# Patient Record
Sex: Male | Born: 1980 | Race: Black or African American | Hispanic: No | Marital: Single | Smoking: Current every day smoker
Health system: Southern US, Community
[De-identification: ages and names within clinical notes are randomized; demographics above are authoritative.]

## PROBLEM LIST (undated history)

## (undated) DIAGNOSIS — R011 Cardiac murmur, unspecified: Secondary | ICD-10-CM

## (undated) DIAGNOSIS — S0990XA Unspecified injury of head, initial encounter: Secondary | ICD-10-CM

## (undated) DIAGNOSIS — S329XXA Fracture of unspecified parts of lumbosacral spine and pelvis, initial encounter for closed fracture: Secondary | ICD-10-CM

## (undated) DIAGNOSIS — J939 Pneumothorax, unspecified: Secondary | ICD-10-CM

## (undated) DIAGNOSIS — R569 Unspecified convulsions: Secondary | ICD-10-CM

## (undated) HISTORY — PX: KNEE SURGERY: SHX244

## (undated) HISTORY — PX: THORACENTESIS: SHX235

---
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
## 2000-04-09 ENCOUNTER — Emergency Department (HOSPITAL_COMMUNITY): Admission: EM | Admit: 2000-04-09 | Discharge: 2000-04-09 | Payer: Self-pay | Admitting: Emergency Medicine

## 2000-04-10 ENCOUNTER — Encounter: Payer: Self-pay | Admitting: Emergency Medicine

## 2003-11-05 ENCOUNTER — Emergency Department (HOSPITAL_COMMUNITY): Admission: EM | Admit: 2003-11-05 | Discharge: 2003-11-05 | Payer: Self-pay | Admitting: Family Medicine

## 2005-08-12 ENCOUNTER — Ambulatory Visit (HOSPITAL_COMMUNITY): Admission: RE | Admit: 2005-08-12 | Discharge: 2005-08-12 | Payer: Self-pay | Admitting: Family Medicine

## 2005-08-12 ENCOUNTER — Emergency Department (HOSPITAL_COMMUNITY): Admission: EM | Admit: 2005-08-12 | Discharge: 2005-08-12 | Payer: Self-pay | Admitting: Family Medicine

## 2006-03-02 ENCOUNTER — Emergency Department (HOSPITAL_COMMUNITY): Admission: EM | Admit: 2006-03-02 | Discharge: 2006-03-02 | Payer: Self-pay | Admitting: Emergency Medicine

## 2006-05-26 DIAGNOSIS — S329XXA Fracture of unspecified parts of lumbosacral spine and pelvis, initial encounter for closed fracture: Secondary | ICD-10-CM

## 2006-05-26 HISTORY — DX: Fracture of unspecified parts of lumbosacral spine and pelvis, initial encounter for closed fracture: S32.9XXA

## 2006-05-26 HISTORY — PX: BONY PELVIS SURGERY: SHX572

## 2006-06-20 ENCOUNTER — Inpatient Hospital Stay (HOSPITAL_COMMUNITY): Admission: EM | Admit: 2006-06-20 | Discharge: 2006-07-09 | Payer: Self-pay | Admitting: Emergency Medicine

## 2006-06-22 ENCOUNTER — Encounter: Payer: Self-pay | Admitting: Vascular Surgery

## 2006-06-29 ENCOUNTER — Ambulatory Visit: Payer: Self-pay | Admitting: Physical Medicine & Rehabilitation

## 2006-07-21 ENCOUNTER — Emergency Department (HOSPITAL_COMMUNITY): Admission: EM | Admit: 2006-07-21 | Discharge: 2006-07-21 | Payer: Self-pay | Admitting: Emergency Medicine

## 2006-08-03 ENCOUNTER — Ambulatory Visit (HOSPITAL_COMMUNITY): Admission: RE | Admit: 2006-08-03 | Discharge: 2006-08-03 | Payer: Self-pay | Admitting: Orthopaedic Surgery

## 2006-08-28 ENCOUNTER — Encounter: Admission: RE | Admit: 2006-08-28 | Discharge: 2006-08-28 | Payer: Self-pay | Admitting: Neurosurgery

## 2006-10-06 ENCOUNTER — Inpatient Hospital Stay (HOSPITAL_COMMUNITY): Admission: AD | Admit: 2006-10-06 | Discharge: 2006-10-09 | Payer: Self-pay | Admitting: Orthopedic Surgery

## 2009-06-04 ENCOUNTER — Emergency Department (HOSPITAL_COMMUNITY): Admission: EM | Admit: 2009-06-04 | Discharge: 2009-06-04 | Payer: Self-pay | Admitting: Family Medicine

## 2010-02-28 DIAGNOSIS — J930 Spontaneous tension pneumothorax: Secondary | ICD-10-CM | POA: Insufficient documentation

## 2010-02-28 DIAGNOSIS — J189 Pneumonia, unspecified organism: Secondary | ICD-10-CM | POA: Insufficient documentation

## 2010-02-28 DIAGNOSIS — D568 Other thalassemias: Secondary | ICD-10-CM

## 2010-02-28 DIAGNOSIS — R011 Cardiac murmur, unspecified: Secondary | ICD-10-CM

## 2010-02-28 DIAGNOSIS — J939 Pneumothorax, unspecified: Secondary | ICD-10-CM | POA: Insufficient documentation

## 2010-02-28 DIAGNOSIS — D560 Alpha thalassemia: Secondary | ICD-10-CM | POA: Insufficient documentation

## 2010-03-04 ENCOUNTER — Ambulatory Visit: Payer: Self-pay | Admitting: Cardiology

## 2010-03-04 ENCOUNTER — Ambulatory Visit (HOSPITAL_COMMUNITY): Admission: RE | Admit: 2010-03-04 | Discharge: 2010-03-04 | Payer: Self-pay

## 2010-03-04 ENCOUNTER — Encounter: Payer: Self-pay | Admitting: Cardiovascular Disease

## 2010-03-04 ENCOUNTER — Ambulatory Visit: Payer: Self-pay | Admitting: Cardiovascular Disease

## 2010-03-06 ENCOUNTER — Telehealth (INDEPENDENT_AMBULATORY_CARE_PROVIDER_SITE_OTHER): Payer: Self-pay | Admitting: *Deleted
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
## 2010-06-25 NOTE — Progress Notes (Signed)
  Mailed Pt 3 ROI on 02/17/10.. she completed I recieved back today faxed records to [facility] on [address] office, The other Samule Dry is for records to go to [facility] I hae forwarded that ROI to [facility]. [facility]  March 06, 2010 11:12 AM

## 2010-06-25 NOTE — Letter (Signed)
Summary: [facility] Surgical Clearance   [facility] Surgical Clearance   Imported By: Roderic Ovens 03/11/2010 15:06:33  _____________________________________________________________________  External Attachment:    Type:   Image     Comment:   External Document
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
## 2010-10-11 NOTE — Op Note (Signed)
[name], [name]               ACCOUNT NO.:  [id number]   MEDICAL RECORD NO.:  [id number]          PATIENT TYPE:  AMB   LOCATION:  SDS                          FACILITY:  [facility]   PHYSICIAN:  Mark C. Ophelia Charter, M.D.    DATE OF BIRTH:  Oct 11, 1980   DATE OF PROCEDURE:  08/03/2006  DATE OF DISCHARGE:                               OPERATIVE REPORT   PREOPERATIVE DIAGNOSIS:  healed pelvic  fracture.,  external fixator   POSTOPERATIVE DIAGNOSIS:  healed pelvic  fracture, external fixator   PROCEDURE:  Removal of external fixator of the pelvis.   SURGEON:  Mark C. Ophelia Charter, M.D.   ANESTHESIA:  GOT.   DESCRIPTION OF PROCEDURE:  After induction of general anesthesia, pins  were sprayed.  Fixator was loosened, disconnected from the pins and then  sterilized since the patient wanted to keep it.  The pins were then  removed using the power driver under slow speed.  Then 4x4 dressings  were applied and tape.  Prior to applying the dressings, the pelvis was  grasped on both sides and stressed both vertically as well as open book  as well as with compression and there appeared to be a stable pelvic  ring.  The patient will do pin tract care at home and return in 1 week  for [place] single stance pelvic x-rays in my office.  Patient  tolerated the procedure well and will be discharged home today.      Mark C. Ophelia Charter, M.D.  Electronically Signed     MCY/MEDQ  D:  08/03/2006  T:  08/03/2006  Job:  [id number]
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
## 2011-12-22 ENCOUNTER — Emergency Department (INDEPENDENT_AMBULATORY_CARE_PROVIDER_SITE_OTHER)
Admission: EM | Admit: 2011-12-22 | Discharge: 2011-12-22 | Disposition: A | Discharge status: Other Acute Inpatient Hospital | Payer: BC Managed Care – PPO | Source: Home / Self Care | Attending: Family Medicine | Admitting: Family Medicine

## 2011-12-22 ENCOUNTER — Emergency Department (INDEPENDENT_AMBULATORY_CARE_PROVIDER_SITE_OTHER): Payer: BC Managed Care – PPO

## 2011-12-22 ENCOUNTER — Emergency Department (HOSPITAL_COMMUNITY)
Admission: EM | Admit: 2011-12-22 | Discharge: 2011-12-22 | Disposition: A | Discharge status: Home / Self Care | Payer: BC Managed Care – PPO | Attending: Emergency Medicine | Admitting: Emergency Medicine

## 2011-12-22 ENCOUNTER — Encounter (HOSPITAL_COMMUNITY): Payer: Self-pay | Admitting: *Deleted

## 2011-12-22 ENCOUNTER — Encounter (HOSPITAL_COMMUNITY): Payer: Self-pay

## 2011-12-22 DIAGNOSIS — Z86718 Personal history of other venous thrombosis and embolism: Secondary | ICD-10-CM | POA: Insufficient documentation

## 2011-12-22 DIAGNOSIS — R071 Chest pain on breathing: Secondary | ICD-10-CM

## 2011-12-22 DIAGNOSIS — R0602 Shortness of breath: Secondary | ICD-10-CM | POA: Insufficient documentation

## 2011-12-22 DIAGNOSIS — R079 Chest pain, unspecified: Secondary | ICD-10-CM

## 2011-12-22 DIAGNOSIS — F17200 Nicotine dependence, unspecified, uncomplicated: Secondary | ICD-10-CM | POA: Insufficient documentation

## 2011-12-22 DIAGNOSIS — G40909 Epilepsy, unspecified, not intractable, without status epilepticus: Secondary | ICD-10-CM | POA: Insufficient documentation

## 2011-12-22 HISTORY — DX: Pneumothorax, unspecified: J93.9

## 2011-12-22 HISTORY — DX: Unspecified convulsions: R56.9

## 2011-12-22 HISTORY — DX: Cardiac murmur, unspecified: R01.1

## 2011-12-22 LAB — D-DIMER, QUANTITATIVE: D-Dimer, Quant: 0.29 ug/mL-FEU (ref 0.00–0.48)

## 2011-12-22 MED ORDER — MORPHINE SULFATE 2 MG/ML IJ SOLN
INTRAMUSCULAR | Status: AC
  Filled 2011-12-22: qty 1

## 2011-12-22 MED ORDER — KETOROLAC TROMETHAMINE 30 MG/ML IJ SOLN
30.0000 mg | Freq: Once | INTRAMUSCULAR | Status: AC
  Administered 2011-12-22: 30 mg via INTRAVENOUS
  Filled 2011-12-22: qty 1

## 2011-12-22 MED ORDER — SODIUM CHLORIDE 0.9 % IV SOLN
Freq: Once | INTRAVENOUS | Status: AC
  Administered 2011-12-22: 15:00:00 via INTRAVENOUS

## 2011-12-22 MED ORDER — MORPHINE SULFATE 2 MG/ML IJ SOLN
2.0000 mg | Freq: Once | INTRAMUSCULAR | Status: AC
  Administered 2011-12-22: 2 mg via INTRAVENOUS

## 2011-12-22 MED ORDER — NAPROXEN 500 MG PO TABS: 500.0000 mg | ORAL_TABLET | Freq: Two times a day (BID) | ORAL | Status: DC

## 2011-12-22 MED ORDER — NAPROXEN 250 MG PO TABS
500.0000 mg | ORAL_TABLET | Freq: Once | ORAL | Status: AC
  Administered 2011-12-22: 500 mg via ORAL
  Filled 2011-12-22: qty 2

## 2011-12-22 NOTE — ED Provider Notes (Signed)
I have seen and examined this patient with the resident.  I agree with the resident's note, assessment and plan except as indicated.    Patient presents with left-sided chest pain with sudden onset.  He states that similar to his prior pneumothorax.  At this time he did have a chest x-ray completed at the urgent care which appears normal by the radiology results and my visualization of the chest x-ray as well.  Patient notably has oxygen saturations of 100% on room air as well.  Because the pain is pleuritic we will consider PE although the patient again is not hypoxic and not tachycardic.  He has no specific risk factors or hemoptysis we believe he is low risk and we will obtain a d-dimer to further assess for this possibility.  Nat Christen, MD 12/22/11 [phone]

## 2011-12-22 NOTE — ED Notes (Signed)
To ED via ambulance
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
## 2011-12-22 NOTE — ED Notes (Signed)
Hist of pneumothorax, left side w susequent treatment and resolution; unable to breathe deeply or speak

## 2011-12-22 NOTE — ED Notes (Signed)
Pt came from urgent care after having shortness or breath and hurts to breath just today.  History of pneumothorax. And chest xray at urgent care negative.  Pt received Morphine at urgent care and per nurse there reported patient said he had a seizure. Patient has history of seizures.  Pt  Is alert and talking with no voice.  Texting

## 2011-12-22 NOTE — ED Provider Notes (Signed)
History     CSN: [id number]  Arrival date & time 12/22/11  1521   First MD Initiated Contact with Patient 12/22/11 1526      Chief Complaint  Patient presents with  . Shortness of Breath    (Consider location/radiation/quality/duration/timing/severity/associated sxs/prior treatment) Patient is a 31 y.o. male presenting with chest pain. The history is provided by the patient.  Chest Pain The chest pain began 3 - 5 hours ago. Duration of episode(s) is 4 hours. Chest pain occurs constantly. The chest pain is unchanged. The pain is associated with breathing and exertion. The severity of the pain is moderate. The quality of the pain is described as sharp and similar to previous episodes. The pain radiates to the mid back. Chest pain is worsened by certain positions and deep breathing. Primary symptoms include shortness of breath. Pertinent negatives for primary symptoms include no fever, no fatigue, no cough, no wheezing, no abdominal pain, no nausea and no vomiting.  Pertinent negatives for associated symptoms include no numbness and no weakness. He tried nothing for the symptoms.  His past medical history is significant for DVT and spontaneous pneumothorax.     Past Medical History  Diagnosis Date  . Pneumothorax, left   . Seizures   . Murmur     Past Surgical History  Procedure Date  . Thoracentesis     History reviewed. No pertinent family history.  History  Substance Use Topics  . Smoking status: Current Everyday Smoker  . Smokeless tobacco: Not on file  . Alcohol Use: Yes      Review of Systems  Constitutional: Negative for fever, activity change, appetite change and fatigue.  HENT: Negative for congestion, sore throat, facial swelling, rhinorrhea, trouble swallowing, neck pain, neck stiffness, voice change and sinus pressure.   Eyes: Negative.   Respiratory: Positive for shortness of breath. Negative for cough, choking, chest tightness and wheezing.     Cardiovascular: Positive for chest pain.  Gastrointestinal: Negative for nausea, vomiting and abdominal pain.  Genitourinary: Negative for dysuria, urgency, frequency, hematuria, flank pain and difficulty urinating.  Musculoskeletal: Negative for back pain and gait problem.  Skin: Negative for rash and wound.  Neurological: Negative for facial asymmetry, weakness, numbness and headaches.  Psychiatric/Behavioral: Negative for behavioral problems, confusion and agitation. The patient is not nervous/anxious and is not hyperactive.   All other systems reviewed and are negative.    Allergies  Review of patient's allergies indicates not on file.  Home Medications  No current outpatient prescriptions on file.  BP 119/80  Pulse 64  Temp 98.5 F (36.9 C) (Oral)  Resp 18  SpO2 97%  Physical Exam  Nursing note and vitals reviewed. Constitutional: He is oriented to person, place, and time. He appears well-developed and well-nourished. No distress.  HENT:  Head: Normocephalic and atraumatic.  Right Ear: External ear normal.  Left Ear: External ear normal.  Mouth/Throat: No oropharyngeal exudate.  Eyes: Conjunctivae and EOM are normal. Pupils are equal, round, and reactive to light. Right eye exhibits no discharge. Left eye exhibits no discharge.  Neck: Normal range of motion. Neck supple. No JVD present. No tracheal deviation present. No thyromegaly present.  Cardiovascular: Normal rate, regular rhythm, normal heart sounds and intact distal pulses.  Exam reveals no gallop and no friction rub.   No murmur heard. Pulmonary/Chest: Effort normal and breath sounds normal. No respiratory distress. He has no wheezes. He exhibits tenderness (pain with palpation of left chest wall).  Abdominal: Soft. Bowel sounds  are normal. He exhibits no distension. There is no tenderness. There is no rebound and no guarding.  Musculoskeletal: Normal range of motion. He exhibits no edema and no tenderness.   Lymphadenopathy:    He has no cervical adenopathy.  Neurological: He is alert and oriented to person, place, and time. No cranial nerve deficit.  Skin: Skin is warm and dry. No rash noted. He is not diaphoretic. No pallor.  Psychiatric: He has a normal mood and affect. His behavior is normal.    ED Course  Procedures (including critical care time)   Labs Reviewed  D-DIMER, QUANTITATIVE   Dg Chest 2 View  12/22/2011  *RADIOLOGY REPORT*  Clinical Data: Shortness of breath, sudden onset of left-sided chest pain, history of pneumothorax  CHEST - 2 VIEW  Comparison: 08/03/2006; 06/24/2006; 06/20/2006; chest CT - 06/20/2006  Findings: Grossly unchanged cardiac silhouette and mediastinal contours. Scattered punctate granulomas, best appreciated on the lateral radiograph, are grossly unchanged and the sequela of prior granulomatous infection.  No new focal parenchymal opacities.  The lungs are hyperexpanded with flattening of bilateral hemidiaphragms.  No focal airspace opacities.  No definite pleural effusion or pneumothorax.  No acute osseous abnormalities.  IMPRESSION: No acute cardiopulmonary disease.  Original Report Authenticated By: Waynard Reeds, M.D.     No diagnosis found.    MDM  31 year old male with past medical history of seizures spontaneous pneumothorax and a DVT associated with a PICC line placed in 2009 not on anticoagulation presents with left-sided pleuritic chest pain. Patient says this pain is similar to pain he felt with previous pneumothorax, patient having difficulty taking deep breaths and speaking secondary to pain in the left side of chest radiating to his mid back. No recent illnesses nausea vomiting fevers. Patient lower risk for ACS given lack of past medical history age and atypical presentation of chest pain. Chest x-ray at urgent care clinic does not show pneumothorax or pneumonia, EKG does not show evidence of pericarditis or ST elevations. Given pleuritic  nature of pain and history of blood clots we'll order d-dimer is patient is otherwise low risk given normal vital signs. Will help treat pain and emergency department with Toradol.  Results for orders placed during the hospital encounter of 12/22/11  D-DIMER, QUANTITATIVE      Component Value Range   D-Dimer, Quant 0.29  0.00 - 0.48 ug/mL-FEU     On reevaluation patient appears better with less splinting and speaking more laterally and clearly. Patient still says pain is there. Patient with normal chest x-ray negative d-dimer normal EKG with pleuritic left-sided chest pain worse with movement of arm. Likely musculoskeletal or pleurisy in nature. We'll prescribe patient's naproxen and tell him to return to the emergency department with any worsening symptoms  Case discussed with Dr. Bobby Rumpf, MD 12/22/11 1705

## 2011-12-25 NOTE — ED Provider Notes (Signed)
I have seen and examined this patient with the resident.  I agree with the resident's note, assessment and plan except as indicated.     Beauden Tremont A Kymere Fullington, MD 12/25/11 0102 

## 2012-08-30 ENCOUNTER — Emergency Department (INDEPENDENT_AMBULATORY_CARE_PROVIDER_SITE_OTHER)
Admission: EM | Admit: 2012-08-30 | Discharge: 2012-08-30 | Disposition: A | Discharge status: Home / Self Care | Payer: BC Managed Care – PPO | Source: Home / Self Care

## 2012-08-30 ENCOUNTER — Encounter (HOSPITAL_COMMUNITY): Payer: Self-pay | Admitting: *Deleted

## 2012-08-30 DIAGNOSIS — Z0000 Encounter for general adult medical examination without abnormal findings: Secondary | ICD-10-CM

## 2012-08-30 HISTORY — DX: Unspecified injury of head, initial encounter: S09.90XA

## 2012-08-30 HISTORY — DX: Fracture of unspecified parts of lumbosacral spine and pelvis, initial encounter for closed fracture: S32.9XXA
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
## 2012-08-30 NOTE — ED Notes (Signed)
Girlfriend has a UTI but had neg STD check.  Now he wants an STD check.

## 2013-05-17 ENCOUNTER — Encounter (HOSPITAL_COMMUNITY): Payer: Self-pay | Admitting: Emergency Medicine

## 2013-05-17 ENCOUNTER — Emergency Department (HOSPITAL_COMMUNITY)
Admission: EM | Admit: 2013-05-17 | Discharge: 2013-05-17 | Disposition: A | Discharge status: Home / Self Care | Payer: BC Managed Care – PPO | Source: Home / Self Care | Attending: Family Medicine | Admitting: Family Medicine

## 2013-05-17 DIAGNOSIS — K2970 Gastritis, unspecified, without bleeding: Secondary | ICD-10-CM

## 2013-05-17 DIAGNOSIS — J069 Acute upper respiratory infection, unspecified: Secondary | ICD-10-CM

## 2013-05-17 DIAGNOSIS — K2990 Gastroduodenitis, unspecified, without bleeding: Secondary | ICD-10-CM

## 2013-05-17 MED ORDER — IPRATROPIUM BROMIDE 0.06 % NA SOLN: 2.0000 | Freq: Four times a day (QID) | NASAL | Status: DC

## 2013-05-17 MED ORDER — ONDANSETRON HCL 8 MG PO TABS: 8.0000 mg | ORAL_TABLET | Freq: Three times a day (TID) | ORAL | Status: DC | PRN

## 2013-05-17 MED ORDER — TRAMADOL HCL 50 MG PO TABS: 50.0000 mg | ORAL_TABLET | Freq: Four times a day (QID) | ORAL | Status: DC | PRN

## 2013-05-17 MED ORDER — GI COCKTAIL ~~LOC~~
30.0000 mL | Freq: Once | ORAL | Status: AC
  Administered 2013-05-17: 30 mL via ORAL

## 2013-05-17 MED ORDER — ONDANSETRON 4 MG PO TBDP
ORAL_TABLET | ORAL | Status: AC
  Filled 2013-05-17: qty 2

## 2013-05-17 MED ORDER — ONDANSETRON 4 MG PO TBDP
8.0000 mg | ORAL_TABLET | Freq: Once | ORAL | Status: AC
  Administered 2013-05-17: 8 mg via ORAL

## 2013-05-17 MED ORDER — OMEPRAZOLE 40 MG PO CPDR: 40.0000 mg | DELAYED_RELEASE_CAPSULE | Freq: Every day | ORAL | Status: DC

## 2013-05-17 MED ORDER — GI COCKTAIL ~~LOC~~
ORAL | Status: AC
  Filled 2013-05-17: qty 30

## 2013-05-17 NOTE — ED Notes (Signed)
Pt  Reports symptoms  Of  Nasal  Congestion  /     Cough        Body  Aches         X  sev  Weeks   Started  Vomiting  Today  -   Symptoms  Not  releived  By otc  meds

## 2013-05-17 NOTE — ED Provider Notes (Signed)
[name] is a 31 y.o. male who presents to Urgent Care today for  1) one and a half weeks of nasal congestion cough runny nose. Patient has tried some over-the-counter medications including Sudafed these medications as well as some ibuprofen. Additionally he notes he's had some back pain of the past her weeks because he works for [employer] and has been very busy recently. He has been taking a lot of aspirin containing products for this. 2) he does not upper left quadrant abdominal pain and vomiting starting today. He denies any melanotic stools or blood in his vomit. His abdominal pain is mild. Of note he drinks 1-3 cans of beer per week.  No fevers chills or trouble breathing chest pains or palpitations.   Past Medical History  Diagnosis Date  . Pneumothorax, left   . Seizures   . Murmur   . Pelvic fracture 2008    external fixator  . Closed head injury     "bleeding and bruising of brain on both sides"   History  Substance Use Topics  . Smoking status: Current Every Day Smoker -- 0.30 packs/day    Types: Cigarettes  . Smokeless tobacco: Not on file  . Alcohol Use: 0.0 oz/week    14-28 Cans of beer per week   ROS as above Medications reviewed. No current facility-administered medications for this encounter.   Current Outpatient Prescriptions  Medication Sig Dispense Refill  . divalproex (DEPAKOTE SPRINKLE) 125 MG capsule Take 125 mg by mouth 2 (two) times daily.        Exam:  BP 137/77  Pulse 78  Temp(Src) 98 F (36.7 C) (Oral)  Resp 16  SpO2 100% Gen: Well NAD non-toxic appear HEENT: EOMI,  MMM, tympanic membranes are normal appearing bilaterally. Posterior pharynx is mildly erythematous. Lungs: Normal work of breathing. CTABL Heart: RRR no MRG Abd: NABS, Soft. Nondistended. Mildly tender palpation of the left quadrant. No rebound or guarding. Exts: Non edematous BL  LE, warm and well perfused.   Patient was given a GI cocktail, and some Zofran. He had some  improvement in symptoms   Assessment and Plan: 32 y.o. male with  1) Viral URI: Plan for symptomatic management with Tylenol, and Atrovent nasal spray. 2) gastritis: Plan to abstain from aspirin or ibuprofen products. Will use Tylenol and tramadol for pain. Will use omeprazole for acid blocking and Zofran for any further vomiting. Work note provided. Discussed warning signs or symptoms. Please see discharge instructions. Patient expresses understanding.      Rodolph Bong, MD 05/17/13 [phone]

## 2013-08-08 ENCOUNTER — Emergency Department (HOSPITAL_COMMUNITY)
Admission: EM | Admit: 2013-08-08 | Discharge: 2013-08-08 | Disposition: A | Discharge status: Home / Self Care | Payer: BC Managed Care – PPO | Source: Home / Self Care | Attending: Emergency Medicine | Admitting: Emergency Medicine

## 2013-08-08 ENCOUNTER — Encounter (HOSPITAL_COMMUNITY): Payer: Self-pay | Admitting: Emergency Medicine

## 2013-08-08 DIAGNOSIS — S1093XA Contusion of unspecified part of neck, initial encounter: Secondary | ICD-10-CM

## 2013-08-08 DIAGNOSIS — S0003XA Contusion of scalp, initial encounter: Secondary | ICD-10-CM

## 2013-08-08 DIAGNOSIS — S0083XA Contusion of other part of head, initial encounter: Secondary | ICD-10-CM

## 2013-08-08 MED ORDER — DICLOFENAC SODIUM 75 MG PO TBEC: 75.0000 mg | DELAYED_RELEASE_TABLET | Freq: Two times a day (BID) | ORAL | Status: DC

## 2013-08-08 MED ORDER — HYDROCODONE-ACETAMINOPHEN 5-325 MG PO TABS
2.0000 | ORAL_TABLET | Freq: Once | ORAL | Status: AC
  Administered 2013-08-08: 2 via ORAL

## 2013-08-08 MED ORDER — HYDROCODONE-ACETAMINOPHEN 5-325 MG PO TABS
ORAL_TABLET | ORAL | Status: AC
  Filled 2013-08-08: qty 2

## 2013-08-08 MED ORDER — DIVALPROEX SODIUM 125 MG PO CPSP: 125.0000 mg | ORAL_CAPSULE | Freq: Two times a day (BID) | ORAL | Status: DC
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
## 2013-08-08 NOTE — Discharge Instructions (Signed)
Head Injury, Adult °You have a head injury. Headaches and throwing up (vomiting) are common after a head injury. It should be easy to wake up from sleeping. Sometimes you must stay in the hospital. Most problems happen within the first 24 hours. Side effects may occur up to 7 10 days after the injury.  °WHAT ARE THE TYPES OF HEAD INJURIES? °Head injuries can be as minor as a bump. Some head injuries can be more severe. More severe head injuries include: °· A jarring injury to the brain (concussion). °· A bruise of the brain (contusion). This mean there is bleeding in the brain that can cause swelling. °· A cracked skull (skull fracture). °· Bleeding in the brain that collects, clots, and forms a bump (hematoma). °. °WHEN SHOULD I GET HELP RIGHT AWAY?  °· You are confused or sleepy. °· You cannot be woken up. °· You feel sick to your stomach (nauseous) or keep throwing up. °· Your dizziness or unsteadiness is get worse. °· You have very bad, lasting headaches that are not helped by medicine. °· You cannot use your arms or legs like normal °· You cannot walk. °· You notice changes in the black spots in the center of the colored part of your eye (pupil). °· You have clear or bloody fluid coming from your nose or ears. °· You have trouble seeing. °During the next 24 hours after the injury, you must stay with someone who can watch you. This person should get help right away (call 911 in the [place] if you start to shake and are not able to control it (seizures), you become pass out, or you are unable to wake up. °HOW CAN I PREVENT A HEAD INJURY IN THE FUTURE? °· Wear seat belts. °· Wear helmets while bike riding and playing sports like football. °· Stay away from dangerous activities around the house. °WHEN CAN I RETURN TO NORMAL ACTIVITIES AND ATHLETICS? °See your doctor before doing these activities. You should not do normal activities or play contact sports until 1 week after the following symptoms have  stopped: °· Headache that does not go away. °· Dizziness. °· Poor attention. °· Confusion. °· Memory problems. °· Sickness to your stomach or throwing up. °· Tiredness. °· Fussiness. °· Bothered by bright lights or loud noises. °· Anxiousness or depression. °· Restless sleep. °MAKE SURE YOU:  °· Understand these instructions. °· Will watch your condition. °· Will get help right away if you are not doing well or get worse. °Document Released: 04/24/2008 Document Revised: 03/02/2013 Document Reviewed: 01/17/2013 °ExitCare® Patient Information ©2014 [employer]. ° °
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
## 2013-08-09 ENCOUNTER — Encounter: Payer: Self-pay | Admitting: Neurology

## 2013-08-09 ENCOUNTER — Ambulatory Visit (INDEPENDENT_AMBULATORY_CARE_PROVIDER_SITE_OTHER): Payer: BC Managed Care – PPO | Admitting: Neurology

## 2013-08-09 VITALS — BP 128/78 | HR 65 | Ht 72.0 in | Wt 166.0 lb

## 2013-08-09 DIAGNOSIS — R569 Unspecified convulsions: Secondary | ICD-10-CM

## 2013-08-12 DIAGNOSIS — R569 Unspecified convulsions: Secondary | ICD-10-CM | POA: Insufficient documentation

## 2013-08-12 MED ORDER — DIVALPROEX SODIUM ER 500 MG PO TB24: 500.0000 mg | ORAL_TABLET | Freq: Two times a day (BID) | ORAL | Status: DC
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
## 2013-08-16 ENCOUNTER — Ambulatory Visit (INDEPENDENT_AMBULATORY_CARE_PROVIDER_SITE_OTHER): Payer: BC Managed Care – PPO | Admitting: Radiology

## 2013-08-16 DIAGNOSIS — R569 Unspecified convulsions: Secondary | ICD-10-CM
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
## 2013-08-22 ENCOUNTER — Emergency Department (HOSPITAL_COMMUNITY)
Admission: EM | Admit: 2013-08-22 | Discharge: 2013-08-22 | Disposition: A | Discharge status: Home / Self Care | Payer: BC Managed Care – PPO | Source: Home / Self Care | Attending: Emergency Medicine | Admitting: Emergency Medicine

## 2013-08-22 ENCOUNTER — Other Ambulatory Visit (HOSPITAL_COMMUNITY)
Admission: RE | Admit: 2013-08-22 | Discharge: 2013-08-22 | Disposition: A | Discharge status: Home / Self Care | Payer: BC Managed Care – PPO | Source: Ambulatory Visit | Attending: Emergency Medicine | Admitting: Emergency Medicine

## 2013-08-22 ENCOUNTER — Encounter (HOSPITAL_COMMUNITY): Payer: Self-pay | Admitting: Emergency Medicine

## 2013-08-22 DIAGNOSIS — Z202 Contact with and (suspected) exposure to infections with a predominantly sexual mode of transmission: Secondary | ICD-10-CM

## 2013-08-22 DIAGNOSIS — Z113 Encounter for screening for infections with a predominantly sexual mode of transmission: Secondary | ICD-10-CM | POA: Insufficient documentation

## 2013-08-22 NOTE — ED Provider Notes (Signed)
  Chief Complaint   Chief Complaint  Patient presents with  . Exposure to STD    History of Present Illness   [name] is a 33 year old male who comes in today to be checked for STDs. The patient states that he has been monogamous for years but that recently he had an affair with another woman. His wife doesn't know about this. The other partner told him that she tested positive for HPV on Pap smear. He himself has had no symptoms of warts in the past and no current lesions. He denies any urethral discharge, dysuria, penile pain, sores, blisters, ulcers, or any type of skin lesion on the penis. He has had no inguinal lymphadenopathy no testicular pain or swelling. He does not have any prior history of STDs.  Review of Systems   Other than as noted above, the patient denies any of the following symptoms: Systemic:  No fevers chills, arthralgias, or adenopathy. GI:  No abdominal pain, nausea or vomiting. GU:  No dysuria, penile pain, discharge, itching, dysuria, genital lesions, testicular pain or swelling. Skin:  No rash or itching.  PMFSH   Past medical history, family history, social history, meds, and allergies were reviewed. He has a history of seizures and takes Depakote.  Physical Examination    Vital signs:  BP 136/84  Pulse 77  Temp(Src) 98.5 F (36.9 C) (Oral)  Resp 18  SpO2 100% Gen:  Alert, oriented, in no distress. Abdomen:  Soft and flat, non-distended, and non-tender.  No organomegaly or mass. Genital:  Was completely normal. No evidence of warts. No urethral discharge. No lesions on the penis. Testes are normal. No inguinal lymphadenopathy Skin:  Warm and dry.  No rash.    Labs   DNA probe for gonorrhea, Chlamydia, Trichomonas were obtained. Also obtain serologies for HIV and syphilis. Results are pending. Will call patient back about any positive results.  Assessment   The encounter diagnosis was Potential exposure to STD.  No evidence of  warts.  Plan    1.  Meds:  The following meds were prescribed:   Discharge Medication List as of 08/22/2013  7:00 PM      2.  Patient Education/Counseling:  The patient was given appropriate handouts, self care instructions, and instructed in symptomatic relief.The patient was instructed to inform all sexual contacts, avoid intercourse completely for 2 weeks and then only with a condom.  The patient was told that we would call about all abnormal lab results, and that we would need to report certain kinds of infection to the health department.    3.  Follow up:  The patient was told to follow up here if no better in 3 to 4 days, or sooner if becoming worse in any way, and given some red flag symptoms such as fever, pain, or difficulty urinating which would prompt immediate return.       Reuben Likesavid C Giang Hemme, MD 08/22/13 2122

## 2013-08-22 NOTE — ED Notes (Signed)
Pt is here to be checked for STD for poss exposure He is asymptomatic Alert w/no signs of acute distress.

## 2013-08-22 NOTE — Discharge Instructions (Signed)
You have been diagnosed with a possible STD.  Your results should be back in  1 - 3 days.  We will call you with the results of any positive tests, so if you don't hear from us, you can assume your results are all negative.  If you wish, you can call us here at [phone] and ask for a nurse to give you the results.  They can give you the results of all your tests over the phone.  If your HIV should come back positive, we must give you this result in person to protect your confidentiality.  We can give you a negative HIV result over the phone.   ° °In the meantime, you should avoid intercourse altogether for 1 week.  After that, you should always use condoms--100% of the time.  This will not only prevent pregnancy, but has been shown to prevent HIV, syphilis, gonorrhea, chlamydia, hepatis C and other STDs. ° °If your test comes back positive, we are required by law to report it to the Health Department.  We also suggest you inform your partner or partners so they can get tested and treated as well. ° °You can get STD testing for free at the [facility].  It is recommended that you have repeat testing for HIV and syphilis in 3 and 6 months, since it can take a while for these tests to become positive.  ° °

## 2013-08-23 LAB — RPR: RPR: NONREACTIVE

## 2013-08-23 LAB — URINE CYTOLOGY ANCILLARY ONLY
Chlamydia: NEGATIVE
Neisseria Gonorrhea: NEGATIVE
Trichomonas: NEGATIVE

## 2013-08-23 LAB — HIV ANTIBODY (ROUTINE TESTING W REFLEX): HIV: NONREACTIVE

## 2013-08-25 ENCOUNTER — Telehealth (HOSPITAL_COMMUNITY): Payer: Self-pay | Admitting: *Deleted

## 2013-08-25 NOTE — ED Notes (Signed)
Pt. called on VM but no message. I called pt. back. Pt. verified x 2 and given results- all neg. I explained that I don't call neg. results.  He asked for the HPV result. I told him we did not do one. He said that is what he came to get checked.  Discussed with Dr. Lorenz CoasterKeller. He said there is no HPV test and he told him that when he was here. He said he checked him and he did not have any warts. I told pt. this. He said he did not understand that it was just going to be a visual check for it. Vassie MoselleYork, Newman Waren M 08/25/2013

## 2013-10-07 ENCOUNTER — Encounter: Payer: Self-pay | Admitting: Neurology

## 2013-10-07 ENCOUNTER — Encounter (INDEPENDENT_AMBULATORY_CARE_PROVIDER_SITE_OTHER): Payer: Self-pay

## 2013-10-07 ENCOUNTER — Ambulatory Visit (INDEPENDENT_AMBULATORY_CARE_PROVIDER_SITE_OTHER): Payer: BC Managed Care – PPO | Admitting: Neurology

## 2013-10-07 VITALS — BP 123/76 | HR 66 | Ht 73.0 in | Wt 166.0 lb

## 2013-10-07 DIAGNOSIS — R569 Unspecified convulsions: Secondary | ICD-10-CM

## 2013-10-07 LAB — COMPREHENSIVE METABOLIC PANEL
A/G RATIO: 1.6 (ref 1.1–2.5)
ALBUMIN: 4.1 g/dL (ref 3.5–5.5)
ALT: 13 IU/L (ref 0–44)
AST: 16 IU/L (ref 0–40)
Alkaline Phosphatase: 42 IU/L (ref 39–117)
BUN / CREAT RATIO: 20 — AB (ref 8–19)
BUN: 16 mg/dL (ref 6–20)
CO2: 23 mmol/L (ref 18–29)
CREATININE: 0.82 mg/dL (ref 0.76–1.27)
Calcium: 8.9 mg/dL (ref 8.7–10.2)
Chloride: 104 mmol/L (ref 96–108)
GFR, EST AFRICAN AMERICAN: 133 mL/min/{1.73_m2} (ref >59)
GFR, EST NON AFRICAN AMERICAN: 115 mL/min/{1.73_m2} (ref >59)
GLOBULIN, TOTAL: 2.5 g/dL (ref 1.5–4.5)
GLUCOSE: 91 mg/dL (ref 65–99)
Potassium: 4.4 mmol/L (ref 3.5–5.2)
Sodium: 138 mmol/L (ref 134–144)
TOTAL PROTEIN: 6.6 g/dL (ref 6.0–8.5)

## 2013-10-07 LAB — CBC
HEMATOCRIT: 31.3 % — AB (ref 37.5–51.0)
HEMOGLOBIN: 10.8 g/dL — AB (ref 12.6–17.7)
MCH: 25.4 pg — ABNORMAL LOW (ref 26.6–33.0)
MCHC: 34.5 g/dL (ref 31.5–35.7)
MCV: 74 fL — ABNORMAL LOW (ref 79–97)
Platelets: 260 10*3/uL (ref 150–379)
RBC: 4.26 x10E6/uL (ref 4.14–5.80)
RDW: 15.8 % — ABNORMAL HIGH (ref 12.3–15.4)
WBC: 5.1 10*3/uL (ref 3.4–10.8)

## 2013-10-07 LAB — VALPROIC ACID LEVEL: VALPROIC ACID LVL: 44 ug/mL — AB (ref 50–100)

## 2013-10-07 MED ORDER — DIVALPROEX SODIUM ER 500 MG PO TB24: ORAL_TABLET | ORAL | Status: DC

## 2013-10-07 NOTE — Progress Notes (Signed)
Quick Note:  I have called him, lab showed mild anemia, VPA 44, he needs primary care soon. ______
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
## 2014-03-23 ENCOUNTER — Encounter: Payer: Self-pay | Admitting: *Deleted

## 2014-04-10 ENCOUNTER — Ambulatory Visit (INDEPENDENT_AMBULATORY_CARE_PROVIDER_SITE_OTHER): Payer: BC Managed Care – PPO | Admitting: Nurse Practitioner

## 2014-04-10 ENCOUNTER — Telehealth: Payer: Self-pay | Admitting: Nurse Practitioner

## 2014-04-10 ENCOUNTER — Encounter: Payer: Self-pay | Admitting: Nurse Practitioner

## 2014-04-10 VITALS — BP 122/74 | HR 68 | Ht 73.0 in | Wt 170.0 lb

## 2014-04-10 DIAGNOSIS — F411 Generalized anxiety disorder: Secondary | ICD-10-CM

## 2014-04-10 DIAGNOSIS — G40219 Localization-related (focal) (partial) symptomatic epilepsy and epileptic syndromes with complex partial seizures, intractable, without status epilepticus: Secondary | ICD-10-CM

## 2014-04-10 DIAGNOSIS — R569 Unspecified convulsions: Secondary | ICD-10-CM

## 2014-04-10 DIAGNOSIS — F430 Acute stress reaction: Secondary | ICD-10-CM

## 2014-04-10 DIAGNOSIS — Z5181 Encounter for therapeutic drug level monitoring: Secondary | ICD-10-CM

## 2014-04-10 DIAGNOSIS — G40909 Epilepsy, unspecified, not intractable, without status epilepticus: Secondary | ICD-10-CM

## 2014-04-10 DIAGNOSIS — F419 Anxiety disorder, unspecified: Secondary | ICD-10-CM

## 2014-04-10 MED ORDER — DIVALPROEX SODIUM ER 250 MG PO TB24: 1000.0000 mg | ORAL_TABLET | Freq: Two times a day (BID) | ORAL | Status: DC

## 2014-04-10 MED ORDER — DIVALPROEX SODIUM ER 500 MG PO TB24: ORAL_TABLET | ORAL | Status: DC

## 2014-04-10 NOTE — Telephone Encounter (Signed)
Freida BusmanAllen from [facility] is calling to get verification on Rx Depakote. Please call. Thanks.

## 2014-04-10 NOTE — Telephone Encounter (Signed)
OV note says:  Increase Depakote ER but using the smaller tablet, 250 mg, take 4 tablets every 12 hours, check valproic acid level. It appears the Rx was sent with two different sets of instructions:  divalproex (DEPAKOTE ER) 250 MG 24 hr tablet 960 tablet 3 04/10/2014     Take 4 tablets (1,000 mg total) by mouth every 12 (twelve) hours. Take 2 tablets every 12 hours.    I called back.  Spoke with Caryn BeeKevin.  They will fill Rx as indicated in OV note.

## 2014-04-10 NOTE — Patient Instructions (Addendum)
Increase Depakote ER but using the smaller tablet, 250 mg, take 4 tablets every 12 hours, check valproic acid level. Return to clinic was [name] in 3 months,.If he continues to have recurrent episode, may consider referring him to [facility] EEG monitoring units, if he is doing well, keep current medications, make sure Depakote level is within therapeutic range.   Please remember, common seizure triggers are: Sleep deprivation, dehydration, overheating, stress, hypoglycemia or skipping meals, certain medications or excessive alcohol use, especially stopping alcohol abruptly if you have had heavy alcohol use before (aka alcohol withdrawal seizure). If you have a prolonged seizure over 2-5 minutes or back to back seizures, call or have someone call 911 or take you to the nearest emergency room. You cannot drive a car or operate any other machinery or vehicle within 6 months of a convulsive daytime seizure. Please do not swim alone or take a tub bath for safety. Do not cook with large quantities of boiling water or oil for safety. Take your medicine for seizure prevention regularly and do not skip doses or stop medication abruptly and tone are told to do so by your healthcare provider. Try to get a refill on your antiepileptic medication ahead of time, so you are not at risk of running out. If you run out of the seizure medication and do not have a refill at hand she may run into medication withdrawal seizures. Avoid taking Wellbutrin, narcotic pain medications and tramadol, as they can lower seizure threshold.   Managing Seizure Triggers: Tips for Lifestyle Modification Adapted from the [facility], [facility], [place], [place] and the journal "Clinical Nursing Practice in Epilepsy", Spring 1994.  Developing plans to modify your lifestyle is an important part of seizure preparedness. It's a way that you, as a person with seizures or a parent of a child with  seizures, can take charge and play an active role in your epilepsy care. The following tips are examples of what people can do to manage triggers. Some of these tips may require a change in behavior, others may be ways to adjust your environment or schedule so not everything happens at once. Before choosing tips to try, make sure you've assessed your situation and talked to your doctor and other health care professionals for their suggestions too. Please note that research on the effectiveness of many of these techniques is limited. Many of these tips are common sense suggestions or are from health care professionals and people with epilepsy as to what they have seen and tried.  Noises: People who think they are affected by noises should be sure to talk to their doctor about whether they have a form of 'reflex epilepsy' or if general noise or distraction may be a trigger in another way. People with true reflex epilepsy may respond to specific seizure medicines and should talk to their doctor. Try using earplugs or earphones, especially in noisy or crowded places. Try listening to relaxing music or sounds, or try distracting yourself by singing or focusing on another activity.  Bright, flashing or fluorescent lights: Use polarized or tinted glasses. Use natural lighting when indoors. Focus on distant objects when riding in a car to avoid flickering lights or patterns. Avoid discos, strobe lights or flashing bulbs on holiday decorations. Use computer monitor with minimal contrast glare or use a screen filter. Consult with your doctor about other specific recommendations for computer use.  Sleep: Try to regulate sleeping habits so you have a consistent schedule  and get enough sleep. Keep a log or diary of your sleep patterns, seizures and general well-being. Ask a partner or companion to record his or her observations too. Consider the following ideas to improve sleep.  . Discuss your medicine  schedule with your doctor or nurse. Changing times or doses at night may help sleep. . Limit caffeine and try to avoid it after noon time or mid?afternoon at the latest. . Avoid alcohol and nicotine prior to sleep. . Limit working or studying late at night. Stop work at least one hour before bedtime to allow time to relax. . Exercise in the early evening if possible. . Take warm showers or have someone give you a back rub before bedtime to decrease muscle tension. . Try relaxation exercises before bedtime. . Limit naps and don't nap in the early evening. . If anxious or worried, talk to someone or write down your feelings before going to sleep. Put this away and deal with these worries or concerns in the morning! . If you can't fall asleep within 15 minutes get up and do something else for 15 minutes. Then go back to bed and try again. Don't toss and turn in bed all night.  Exercise: Regular exercise is good for everyone. Pace your exercise to avoid getting too tired or hyperventilation. Avoid exercising in the middle of the day during hot weather. Ask your doctor about any specific exercises you may need to avoid.  Hyperventilation: Try relaxation or slow breathing exercises when anxious or if you begin to hyperventilate. Pace your activity and avoid sports that may trigger hyperventilation.  Diet: Regulate meal times and patterns around sleep, activity, and medication schedules. Usually taking medicines after food or around meals makes it easier to remember them and may lessen any stomach distress from side effects of medicines. Have a well-balanced diet and eat at consistent times to avoid long periods without food. If your appetite is poor, try small frequent meals instead of skipping meals. Avoid foods and drinks that may aggravate seizures. Not everyone is sensitive to foods, but if you are, talk to your doctor about how to modify your diet. If you are following a diet  specifically for your epilepsy, be sure to follow the advice of your doctor and nutritionist.  Alcohol/Drugs: Avoid recreational drugs and talk to your doctor about use of alcohol. Avoid alcohol completely if you're going through high-risk times or have recently had surgery. If you choose to drink alcohol, use 'moderation', drink slowly, and have only one or two glasses at a time. Consider carefully what you drink, avoiding 'hard liquor' or mixed drinks that may have high alcohol content. If alcohol and drugs are a problem for you, talk to your doctor and get professional help.  Hormonal changes: Both men and women may notice a cyclical pattern to their seizures. Record seizures on a calendar and track them in relation to any changes in hormones. Women who are having menstrual cycles should track their cycle days. Women who have stopped having their menses should track other symptoms or changes, while women who are pregnant should track their pregnancy too. The use of hormonal medicines, such as contraceptives or birth control pills as well as hormonal replacement therapy, may affect seizures in some women, so record the dates and doses of these medicines.  NOTE: some seizure medicines may interfere with the effectiveness of hormonal contraceptives making unexpected or unplanned pregnancy more likely. Be sure to talk to your doctor about all contraceptive  use.When seizures cluster around menses or hormone changes, women should try to modify their lifestyle so other triggers don't occur during this high-risk time. Some women may use 'as needed' medicines to help treat seizures associated with menses. Note the use of these on your calendars and seizure preparedness plan.  Illness, fever, trauma: Notify your doctor if you become ill, have a fever, injure yourself seriously, or need other medicines such as antibiotics, painkillers, or cold medicines. Some people may notice that certain medicines can  trigger seizures or interfere with seizure medicines. Fevers, other illnesses and injuries may also make you more susceptible and you'll need to monitor your seizures carefully. Try to limit other triggers during these times and talk to your doctor about what medicines you can use.  Stress, anxiety, depression: Emotional stress is a common trigger for some people, and stress can be a cause and symptom of mood problems such as anxiety and depression. Track your stress level and mood in relation to your seizures on your diary. During stressful times, consider ways to modify your lifestyle and manage stress better. . Try counseling to help cope with seizures or other problems. . Consider support groups for epilepsy, or groups for stress management, therapy, and other support. . Write down feelings in a diary on a regular basis. It helps you get feelings out, rather than hold them in, and can help you see the issues more clearly. . Use 'time-out' periods. Just like kids may need a time-out when they are overwhelmed or acting out, so too do adults. Giving yourself a time-out allows you to take a step back from the stressor or situation and think about how best to address it. . Learn relaxation exercises, deep breathing, yoga, or other strategies that help with stress and general well-being. . Tell your doctor and nurse how you feel. The effects of stress can be harmful to your seizures, and your life. When mood changes last longer than expected, you may need help from a mental health professional too. If you feel emotionally unsafe, call your doctor or go to an emergency room to be evaluated.
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
## 2014-04-11 ENCOUNTER — Telehealth: Payer: Self-pay | Admitting: *Deleted

## 2014-04-11 LAB — TSH: TSH: 2.18 u[IU]/mL (ref 0.450–4.500)

## 2014-04-11 LAB — VALPROIC ACID LEVEL: Valproic Acid Lvl: 54 ug/mL (ref 50–100)

## 2014-04-11 NOTE — Telephone Encounter (Signed)
-----   Message from Ronal FearLynn E Lam, NP sent at 04/11/2014  8:32 AM EST ----- Please notify patient, Valrpoic acid level is in low therapeutic range and TSH is normal. No changes in treatment.

## 2014-04-11 NOTE — Telephone Encounter (Signed)
Spoke with patient that labs came back normal and that there will be no changes in treatment at this time, patient verbalized understanding and had no further questions or concerns.

## 2015-03-21 ENCOUNTER — Other Ambulatory Visit (HOSPITAL_COMMUNITY)
Admission: RE | Admit: 2015-03-21 | Discharge: 2015-03-21 | Disposition: A | Discharge status: Home / Self Care | Payer: BLUE CROSS/BLUE SHIELD | Source: Ambulatory Visit | Attending: Family Medicine | Admitting: Family Medicine

## 2015-03-21 ENCOUNTER — Emergency Department (INDEPENDENT_AMBULATORY_CARE_PROVIDER_SITE_OTHER)
Admission: EM | Admit: 2015-03-21 | Discharge: 2015-03-21 | Disposition: A | Discharge status: Home / Self Care | Payer: BLUE CROSS/BLUE SHIELD | Source: Home / Self Care | Attending: Family Medicine | Admitting: Family Medicine

## 2015-03-21 ENCOUNTER — Encounter (HOSPITAL_COMMUNITY): Payer: Self-pay | Admitting: Emergency Medicine

## 2015-03-21 DIAGNOSIS — Z202 Contact with and (suspected) exposure to infections with a predominantly sexual mode of transmission: Secondary | ICD-10-CM | POA: Diagnosis not present

## 2015-03-21 DIAGNOSIS — Z113 Encounter for screening for infections with a predominantly sexual mode of transmission: Secondary | ICD-10-CM | POA: Insufficient documentation

## 2015-03-21 LAB — POCT URINALYSIS DIP (DEVICE)
Glucose, UA: NEGATIVE mg/dL
HGB URINE DIPSTICK: NEGATIVE
KETONES UR: NEGATIVE mg/dL
Leukocytes, UA: NEGATIVE
NITRITE: NEGATIVE
PH: 5.5 (ref 5.0–8.0)
PROTEIN: NEGATIVE mg/dL
Specific Gravity, Urine: >=1.03 (ref 1.005–1.030)
Urobilinogen, UA: 0.2 mg/dL (ref 0.0–1.0)

## 2015-03-21 MED ORDER — AZITHROMYCIN 250 MG PO TABS
1000.0000 mg | ORAL_TABLET | Freq: Once | ORAL | Status: AC
  Administered 2015-03-21: 1000 mg via ORAL

## 2015-03-21 MED ORDER — AZITHROMYCIN 250 MG PO TABS
ORAL_TABLET | ORAL | Status: AC
  Filled 2015-03-21: qty 4

## 2015-03-21 NOTE — ED Notes (Signed)
Pt requested that I throw his AVS away.  He did not want to take it home.  I let him know I would put it in our shredder and he stated he had no questions about his treatment here today or what to do at home.

## 2015-03-21 NOTE — ED Provider Notes (Signed)
CSN: [id number]     Arrival date & time 03/21/15  1729 History   First MD Initiated Contact with Patient 03/21/15 1839     Chief Complaint  Patient presents with  . Exposure to STD   (Consider location/radiation/quality/duration/timing/severity/associated sxs/prior Treatment) HPI  Called by sexual partner 1 day ago who said she had chlamydia. Sexually active without condoms with this individual. Patient has also been 6 active with his wife since that period time. Denies any penile discharge, penile lesions, coronary adenopathy, fevers, weight loss, abdominal pain. Patient also complaining of intermittent feelings of fatigue and weakness. States that this been ongoing since changing his seizure medicines. No recent seizures.n takes his seizure medicine intermittently which is Depakote.   Past Medical History  Diagnosis Date  . Pneumothorax, left   . Seizures (HCC)   . Murmur   . Pelvic fracture (HCC) 2008    external fixator  . Closed head injury     "bleeding and bruising of brain on both sides"   Past Surgical History  Procedure Laterality Date  . Thoracentesis    . Knee surgery Right '08,'09,'10,'11.    4 surgeries in 4 yrs  . Bony pelvis surgery  2008    external fixator   Family History  Problem Relation Age of Onset  . Diabetes Mother   . High blood pressure Mother   . Seizures Paternal Grandmother   . Seizures Brother    Social History  Substance Use Topics  . Smoking status: Current Every Day Smoker -- 0.30 packs/day for 10 years    Types: Cigarettes  . Smokeless tobacco: Never Used  . Alcohol Use: 8.4 oz/week    14 Cans of beer per week     Comment: 2 beers daily    Review of Systems Per HPI with all other pertinent systems negative.   Allergies  Review of patient's allergies indicates no known allergies.  Home Medications   Prior to Admission medications   Not on File   Meds Ordered and Administered this Visit   Medications  azithromycin  (ZITHROMAX) tablet 1,000 mg (not administered)    BP 137/89 mmHg  Pulse 68  Temp(Src) 98.1 F (36.7 C) (Oral)  Resp 18  SpO2 100% No data found.   Physical Exam Physical Exam  Constitutional: oriented to person, place, and time. appears well-developed and well-nourished. No distress.  HENT:  Head: Normocephalic and atraumatic.  Eyes: EOMI. PERRL.  Neck: Normal range of motion.  Cardiovascular: RRR, no m/r/g, 2+ distal pulses,  Pulmonary/Chest: Effort normal and breath sounds normal. No respiratory distress.  Abdominal: Soft. Bowel sounds are normal. NonTTP, no distension.  Musculoskeletal: Normal range of motion. Non ttp, no effusion.  Neurological: alert and oriented to person, place, and time.  Skin: Skin is warm. No rash noted. non diaphoretic.  Psychiatric: normal mood and affect. behavior is normal. Judgment and thought content normal.   ED Course  Procedures (including critical care time)  Labs Review Labs Reviewed  POCT URINALYSIS DIP (DEVICE) - Abnormal; Notable for the following:    Bilirubin Urine SMALL (*)    All other components within normal limits  URINE CYTOLOGY ANCILLARY ONLY    Imaging Review No results found.   Visual Acuity Review  Right Eye Distance:   Left Eye Distance:   Bilateral Distance:    Right Eye Near:   Left Eye Near:    Bilateral Near:         MDM   1. STD exposure  Azithromycin 1000 mg by mouth given in clinic. STD panel sent. Suspect the patient's other symptoms of    Ozella Rocks, MD 03/21/15 [phone]

## 2015-03-21 NOTE — ED Notes (Signed)
Pt has sexual relations with someone 4-5 times in the last month who has tested positive for chlamydia.  His last encounter was last Thursday or Friday.  Pt has no symptoms.  He also has some complaints about chronic neck pain and not feeling right at work.  Pt is under a bit of stress due to his relationship with his wife and girlfriend at this time.

## 2015-03-21 NOTE — Discharge Instructions (Signed)
You were given treatment for Chlamydia Please use condoms with intercourse Please notify all sexual partners.

## 2015-03-22 LAB — URINE CYTOLOGY ANCILLARY ONLY
CHLAMYDIA, DNA PROBE: POSITIVE — AB
NEISSERIA GONORRHEA: NEGATIVE
Trichomonas: NEGATIVE

## 2015-03-22 NOTE — ED Notes (Signed)
Final report of STD screening positive for chlamydia. Treatment adequate day of visit.  Called patient, and after verifying ID, discussed report. Was advised to practice safer sex, and he is to inform his girlfriend and wife so they may both be treated . Patient verbalized understanding of plan. Form [id number] [employer]HS completed [facility]

## 2016-08-20 ENCOUNTER — Emergency Department (HOSPITAL_COMMUNITY): Payer: BLUE CROSS/BLUE SHIELD

## 2016-08-20 ENCOUNTER — Emergency Department (HOSPITAL_COMMUNITY)
Admission: EM | Admit: 2016-08-20 | Discharge: 2016-08-20 | Disposition: A | Discharge status: Home / Self Care | Payer: BLUE CROSS/BLUE SHIELD | Attending: Emergency Medicine | Admitting: Emergency Medicine

## 2016-08-20 ENCOUNTER — Encounter (HOSPITAL_COMMUNITY): Payer: Self-pay

## 2016-08-20 DIAGNOSIS — Z79899 Other long term (current) drug therapy: Secondary | ICD-10-CM | POA: Insufficient documentation

## 2016-08-20 DIAGNOSIS — S39012A Strain of muscle, fascia and tendon of lower back, initial encounter: Secondary | ICD-10-CM | POA: Diagnosis not present

## 2016-08-20 DIAGNOSIS — X58XXXA Exposure to other specified factors, initial encounter: Secondary | ICD-10-CM | POA: Diagnosis not present

## 2016-08-20 DIAGNOSIS — Y939 Activity, unspecified: Secondary | ICD-10-CM | POA: Insufficient documentation

## 2016-08-20 DIAGNOSIS — Y929 Unspecified place or not applicable: Secondary | ICD-10-CM | POA: Insufficient documentation

## 2016-08-20 DIAGNOSIS — S3992XA Unspecified injury of lower back, initial encounter: Secondary | ICD-10-CM | POA: Diagnosis present

## 2016-08-20 DIAGNOSIS — F17210 Nicotine dependence, cigarettes, uncomplicated: Secondary | ICD-10-CM | POA: Diagnosis not present

## 2016-08-20 DIAGNOSIS — Y999 Unspecified external cause status: Secondary | ICD-10-CM | POA: Diagnosis not present

## 2016-08-20 MED ORDER — CYCLOBENZAPRINE HCL 10 MG PO TABS: 10.0000 mg | ORAL_TABLET | Freq: Two times a day (BID) | ORAL | 0 refills | Status: DC | PRN

## 2016-08-20 MED ORDER — HYDROCODONE-ACETAMINOPHEN 5-325 MG PO TABS: 1.0000 | ORAL_TABLET | Freq: Four times a day (QID) | ORAL | 0 refills | Status: DC | PRN

## 2016-08-20 MED ORDER — DICLOFENAC SODIUM 50 MG PO TBEC: 50.0000 mg | DELAYED_RELEASE_TABLET | Freq: Two times a day (BID) | ORAL | 0 refills | Status: DC
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
## 2016-08-20 NOTE — Discharge Instructions (Signed)
Do not drive or work while taking the narcotic or the muscle relaxant because they will make you sleepy. Follow up with your orthopedic doctor. Return here as needed.
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
## 2016-08-28 ENCOUNTER — Encounter (INDEPENDENT_AMBULATORY_CARE_PROVIDER_SITE_OTHER): Payer: Self-pay | Admitting: Orthopedic Surgery

## 2016-08-28 ENCOUNTER — Ambulatory Visit (INDEPENDENT_AMBULATORY_CARE_PROVIDER_SITE_OTHER): Payer: BLUE CROSS/BLUE SHIELD | Admitting: Orthopedic Surgery

## 2016-08-28 DIAGNOSIS — G8929 Other chronic pain: Secondary | ICD-10-CM

## 2016-08-28 DIAGNOSIS — M545 Low back pain, unspecified: Secondary | ICD-10-CM

## 2016-08-28 MED ORDER — HYDROCODONE-ACETAMINOPHEN 5-325 MG PO TABS: 1.0000 | ORAL_TABLET | Freq: Two times a day (BID) | ORAL | 0 refills | Status: DC | PRN

## 2016-08-28 MED ORDER — PREDNISONE 5 MG (21) PO TBPK: ORAL_TABLET | ORAL | 0 refills | Status: DC

## 2016-08-28 MED ORDER — METHOCARBAMOL 500 MG PO TABS: 500.0000 mg | ORAL_TABLET | Freq: Three times a day (TID) | ORAL | 0 refills | Status: DC | PRN
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
## 2016-09-09 ENCOUNTER — Ambulatory Visit
Admission: RE | Admit: 2016-09-09 | Discharge: 2016-09-09 | Disposition: A | Discharge status: Home / Self Care | Payer: BLUE CROSS/BLUE SHIELD | Source: Ambulatory Visit | Attending: Orthopedic Surgery | Admitting: Orthopedic Surgery

## 2016-09-09 DIAGNOSIS — G8929 Other chronic pain: Secondary | ICD-10-CM

## 2016-09-09 DIAGNOSIS — M545 Low back pain, unspecified: Secondary | ICD-10-CM

## 2016-09-22 ENCOUNTER — Telehealth (INDEPENDENT_AMBULATORY_CARE_PROVIDER_SITE_OTHER): Payer: Self-pay | Admitting: Orthopedic Surgery

## 2016-09-22 MED ORDER — HYDROCODONE-ACETAMINOPHEN 5-325 MG PO TABS: 1.0000 | ORAL_TABLET | Freq: Two times a day (BID) | ORAL | 0 refills | Status: DC | PRN

## 2016-09-22 NOTE — Telephone Encounter (Signed)
On 4/5 had #30 norco 5/325. Ok to refill?

## 2016-09-22 NOTE — Telephone Encounter (Signed)
Ok to rf on Friday pls call thx one time only

## 2016-09-22 NOTE — Telephone Encounter (Signed)
Advised patient per Dr August Saucer. Advised could pick up rx at front desk  rx states not to fill until 09/26/16

## 2016-09-22 NOTE — Addendum Note (Signed)
Addended byPrescott Parma on: 09/22/2016 04:28 PM   Modules accepted: Orders

## 2016-09-22 NOTE — Telephone Encounter (Signed)
PT REQUESTS REFILL OF PAIN MEDS PLEASE.  [phone]

## 2016-09-25 ENCOUNTER — Telehealth (INDEPENDENT_AMBULATORY_CARE_PROVIDER_SITE_OTHER): Payer: Self-pay

## 2016-09-25 ENCOUNTER — Ambulatory Visit (INDEPENDENT_AMBULATORY_CARE_PROVIDER_SITE_OTHER): Payer: BLUE CROSS/BLUE SHIELD | Admitting: Orthopedic Surgery

## 2016-09-25 ENCOUNTER — Encounter (INDEPENDENT_AMBULATORY_CARE_PROVIDER_SITE_OTHER): Payer: Self-pay | Admitting: Orthopedic Surgery

## 2016-09-25 DIAGNOSIS — M545 Low back pain: Secondary | ICD-10-CM

## 2016-09-25 MED ORDER — HYDROXYZINE PAMOATE 50 MG PO CAPS: ORAL_CAPSULE | ORAL | 1 refills | Status: DC
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
## 2016-09-25 NOTE — Addendum Note (Signed)
Addended by: Ashok NorrisNEWTON, Carless Slatten K on: 09/25/2016 05:14 PM   Modules accepted: Orders

## 2016-09-25 NOTE — Telephone Encounter (Signed)
Sent in hydroxyzine

## 2016-09-25 NOTE — Telephone Encounter (Signed)
Patient scheduled for injection on 10/06/16 and would like something to relax him. Uses [facility].

## 2016-10-01 ENCOUNTER — Telehealth (INDEPENDENT_AMBULATORY_CARE_PROVIDER_SITE_OTHER): Payer: Self-pay | Admitting: Orthopedic Surgery

## 2016-10-01 NOTE — Telephone Encounter (Signed)
PT REQUESTED A CALL BACK REGARDING HIS INJECTION SCHEDULED, WANTS TO DISCUSS WHETHER IT IS REALLY NECESSARY OR IF HE SHOULD JUST CONSIDER SURGERY.  [phone]

## 2016-10-02 NOTE — Telephone Encounter (Signed)
Called patient back. He refused to s/w anyone other than Dr August Saucerean. Please advise.

## 2016-10-06 ENCOUNTER — Ambulatory Visit (INDEPENDENT_AMBULATORY_CARE_PROVIDER_SITE_OTHER): Payer: BLUE CROSS/BLUE SHIELD | Admitting: Physical Medicine and Rehabilitation

## 2016-10-06 ENCOUNTER — Ambulatory Visit (INDEPENDENT_AMBULATORY_CARE_PROVIDER_SITE_OTHER): Payer: BLUE CROSS/BLUE SHIELD

## 2016-10-06 ENCOUNTER — Encounter (INDEPENDENT_AMBULATORY_CARE_PROVIDER_SITE_OTHER): Payer: Self-pay | Admitting: Physical Medicine and Rehabilitation

## 2016-10-06 VITALS — BP 136/89 | HR 66

## 2016-10-06 DIAGNOSIS — M5416 Radiculopathy, lumbar region: Secondary | ICD-10-CM

## 2016-10-06 DIAGNOSIS — M5116 Intervertebral disc disorders with radiculopathy, lumbar region: Secondary | ICD-10-CM

## 2016-10-06 MED ORDER — METHYLPREDNISOLONE ACETATE 80 MG/ML IJ SUSP
80.0000 mg | Freq: Once | INTRAMUSCULAR | Status: AC
  Administered 2016-10-06: 80 mg

## 2016-10-06 MED ORDER — LIDOCAINE HCL (PF) 1 % IJ SOLN
2.0000 mL | Freq: Once | INTRAMUSCULAR | Status: AC
  Administered 2016-10-06: 2 mL
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
## 2016-10-06 NOTE — Procedures (Signed)
Lumbosacral Transforaminal Epidural Steroid Injection - Infraneural Approach with Fluoroscopic Guidance  Patient: [name]      Date of Birth: Sep 28, 1980 MRN: [id number] PCP: System, Pcp Not In      Visit Date: 10/06/2016   Universal Protocol:    Date/Time: 05/14/181:14 PM  Consent Given By: the patient  Position: PRONE   Additional Comments: Vital signs were monitored before and after the procedure. Patient was prepped and draped in the usual sterile fashion. The correct patient, procedure, and site was verified.   Injection Procedure Details:  Procedure Site One Meds Administered:  Meds ordered this encounter  Medications  . lidocaine (PF) (XYLOCAINE) 1 % injection 2 mL  . methylPREDNISolone acetate (DEPO-MEDROL) injection 80 mg      Laterality: Right  Location/Site:  L5-S1  Needle size: 22 G  Needle type: Spinal  Needle Placement: Transforaminal  Findings:  -Contrast Used: 1 mL iohexol 180 mg iodine/mL   -Comments: Excellent flow of contrast along the nerve and into the epidural space.  Procedure Details: After squaring off the end-plates of the desired vertebral level to get a true AP view, the C-arm was obliqued to the painful side so that the superior articulating process is positioned about 1/3 the length of the inferior endplate.  The needle was aimed toward the junction of the superior articular process and the transverse process of the inferior vertebrae. The needle's initial entry is in the lower third of the foramen through Kambin's triangle. The soft tissues overlying this target were infiltrated with 2-3 ml. of 1% Lidocaine without Epinephrine.  The spinal needle was then inserted and advanced toward the target using a "trajectory" view along the fluoroscope beam.  Under AP and lateral visualization, the needle was advanced so it did not puncture dura and did not traverse medially beyond the 6 o'clock position of the pedicle. Bi-planar projections  were used to confirm position. Aspiration was confirmed to be negative for CSF and/or blood. A 1-2 ml. volume of Isovue-250 was injected and flow of contrast was noted at each level. Radiographs were obtained for documentation purposes.   After attaining the desired flow of contrast documented above, a 0.5 to 1.0 ml test dose of 0.25% Marcaine was injected into each respective transforaminal space.  The patient was observed for 90 seconds post injection.  After no sensory deficits were reported, and normal lower extremity motor function was noted,   the above injectate was administered so that equal amounts of the injectate were placed at each foramen (level) into the transforaminal epidural space.   Additional Comments:  No complications occurred Dressing: Band-Aid    Post-procedure details: Patient was observed during the procedure. Post-procedure instructions were reviewed.  Patient left the clinic in stable condition.

## 2016-10-06 NOTE — Progress Notes (Deleted)
Pain in the middle of lower back. Radiates across back with increased activity. Denies leg pain but states he does have right knee pain but doesn't think that is related.

## 2016-10-06 NOTE — Patient Instructions (Signed)

## 2016-10-09 NOTE — Telephone Encounter (Signed)
I called.

## 2016-10-10 ENCOUNTER — Telehealth (INDEPENDENT_AMBULATORY_CARE_PROVIDER_SITE_OTHER): Payer: Self-pay

## 2016-10-10 NOTE — Telephone Encounter (Signed)
Dr August Saucerean would like patient to have consult with Dr Ophelia CharterYates for his lumbar spine. I tried calling patient to get this scheduled.  No answer. LMVM for him to call us back and schedule appt to see Dr Ophelia CharterYates.

## 2016-10-14 ENCOUNTER — Ambulatory Visit (INDEPENDENT_AMBULATORY_CARE_PROVIDER_SITE_OTHER): Payer: BLUE CROSS/BLUE SHIELD | Admitting: Orthopaedic Surgery

## 2016-10-14 ENCOUNTER — Encounter (INDEPENDENT_AMBULATORY_CARE_PROVIDER_SITE_OTHER): Payer: Self-pay | Admitting: Orthopaedic Surgery

## 2016-10-14 VITALS — BP 135/81 | HR 67 | Ht 74.0 in | Wt 174.0 lb

## 2016-10-14 DIAGNOSIS — M5126 Other intervertebral disc displacement, lumbar region: Secondary | ICD-10-CM | POA: Diagnosis not present
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
## 2016-11-18 ENCOUNTER — Ambulatory Visit (INDEPENDENT_AMBULATORY_CARE_PROVIDER_SITE_OTHER): Payer: BLUE CROSS/BLUE SHIELD | Admitting: Orthopaedic Surgery

## 2017-12-04 ENCOUNTER — Encounter (HOSPITAL_COMMUNITY): Payer: Self-pay | Admitting: Emergency Medicine

## 2017-12-04 ENCOUNTER — Ambulatory Visit (HOSPITAL_COMMUNITY)
Admission: EM | Admit: 2017-12-04 | Discharge: 2017-12-04 | Disposition: A | Discharge status: Home / Self Care | Payer: BLUE CROSS/BLUE SHIELD | Attending: Family Medicine | Admitting: Family Medicine

## 2017-12-04 DIAGNOSIS — Z113 Encounter for screening for infections with a predominantly sexual mode of transmission: Secondary | ICD-10-CM

## 2017-12-04 DIAGNOSIS — F17210 Nicotine dependence, cigarettes, uncomplicated: Secondary | ICD-10-CM | POA: Diagnosis not present

## 2017-12-04 DIAGNOSIS — R569 Unspecified convulsions: Secondary | ICD-10-CM | POA: Insufficient documentation

## 2017-12-04 DIAGNOSIS — Z202 Contact with and (suspected) exposure to infections with a predominantly sexual mode of transmission: Secondary | ICD-10-CM | POA: Insufficient documentation

## 2017-12-04 DIAGNOSIS — Z79899 Other long term (current) drug therapy: Secondary | ICD-10-CM | POA: Insufficient documentation

## 2017-12-04 MED ORDER — AZITHROMYCIN 250 MG PO TABS
ORAL_TABLET | ORAL | Status: AC
  Filled 2017-12-04: qty 4

## 2017-12-04 MED ORDER — AZITHROMYCIN 250 MG PO TABS
1000.0000 mg | ORAL_TABLET | Freq: Once | ORAL | Status: AC
  Administered 2017-12-04: 1000 mg via ORAL

## 2017-12-04 MED ORDER — CEFTRIAXONE SODIUM 250 MG IJ SOLR
250.0000 mg | Freq: Once | INTRAMUSCULAR | Status: AC
  Administered 2017-12-04: 250 mg via INTRAMUSCULAR

## 2017-12-04 MED ORDER — CEFTRIAXONE SODIUM 250 MG IJ SOLR
INTRAMUSCULAR | Status: AC
  Filled 2017-12-04: qty 250

## 2017-12-04 NOTE — ED Notes (Signed)
Bed: UC01 Expected date:  Expected time:  Means of arrival:  Comments: appointments 
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
## 2017-12-04 NOTE — Discharge Instructions (Signed)
We did lab testing during this visit.  If there are any abnormal findings that require change in medicine or indicate a positive result, you will be notified.  If all of your tests are normal, you will not be called.   ° °
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
## 2017-12-05 LAB — HIV ANTIBODY (ROUTINE TESTING W REFLEX): HIV Screen 4th Generation wRfx: NONREACTIVE

## 2017-12-05 LAB — RPR: RPR: NONREACTIVE

## 2017-12-07 ENCOUNTER — Telehealth (HOSPITAL_COMMUNITY): Payer: Self-pay

## 2017-12-07 LAB — URINE CYTOLOGY ANCILLARY ONLY
Chlamydia: POSITIVE — AB
Neisseria Gonorrhea: NEGATIVE
TRICH (WINDOWPATH): NEGATIVE

## 2017-12-07 NOTE — Telephone Encounter (Signed)
Chlamydia is positive.  This was treated at the urgent care visit with po zithromax 1g.  Pt contacted and made aware of results. Educated pt to please refrain from sexual intercourse for 7 days to give the medicine time to work.  Sexual partners need to be notified and tested/treated.  Condoms may reduce risk of reinfection.  Recheck or followup with PCP for further evaluation if symptoms are not improving.  [facility] notified 

## 2018-12-04 IMAGING — MR MR LUMBAR SPINE W/O CM
4 of 5 series · 19 of 48 positions shown · non-contrast
Comparison: Lumbar spine radiographs 08/20/2016.

CLINICAL DATA: Low back pain.  RIGHT leg pain for 1 month.

EXAM:
MRI LUMBAR SPINE WITHOUT CONTRAST
TECHNIQUE: Multiplanar, multisequence MR imaging of the lumbar spine was
performed. No intravenous contrast was administered.

[Series 6: T2 · sagittal · 4.0mm · 0.73mm/px · 6 of 15 slices shown (1 of 2)]
[im 1/15]
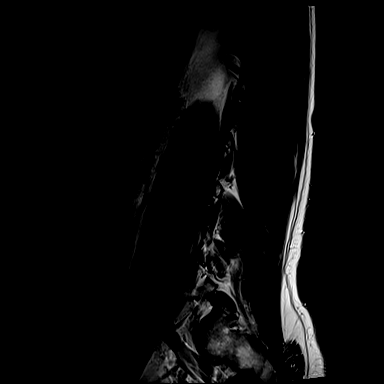
[im 3/15]
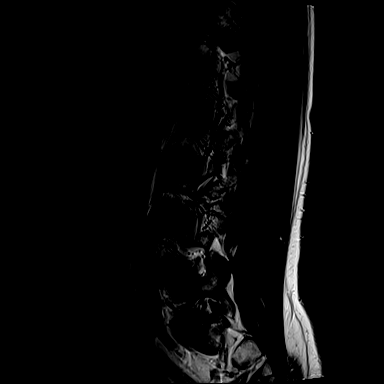
[im 6/15]
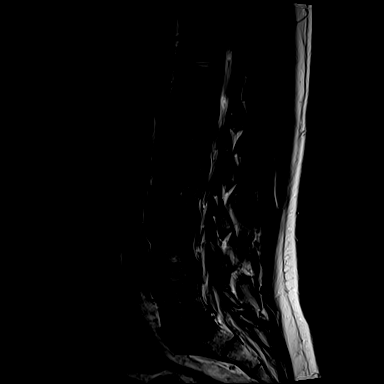
[im 9/15]
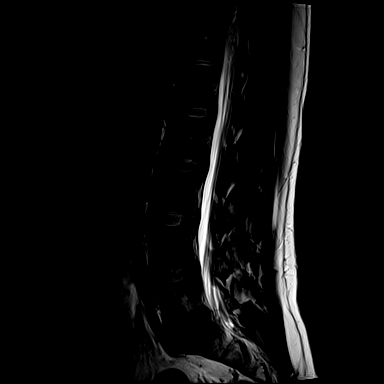
[im 12/15]
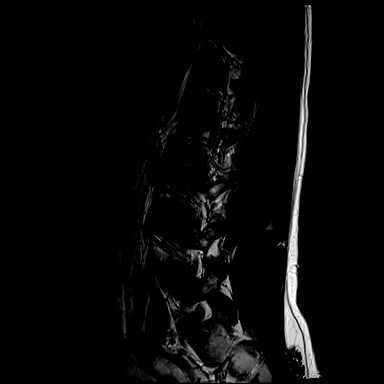
[im 15/15]
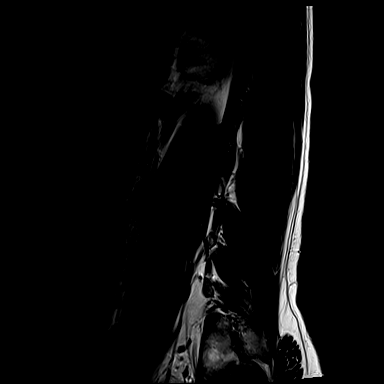

[Series 7: T1 · sagittal · 4.0mm · 0.73mm/px · 3 of 15 slices shown (1 of 2)]
[im 3/15]
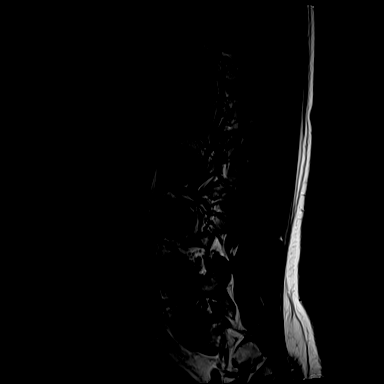
[im 9/15]
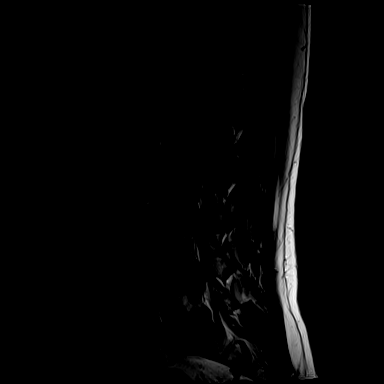
[im 15/15]
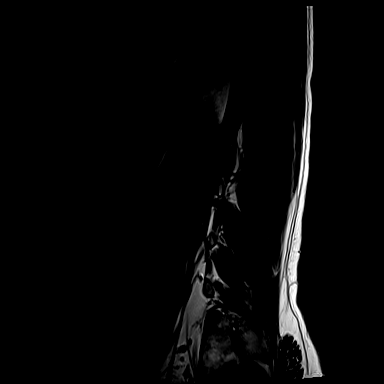

[Series 11: T1 · axial · 4.0mm · 0.28mm/px · z∈[-121,+32]mm · 3 of 39 slices shown (2 of 2)]
[im 6/39]
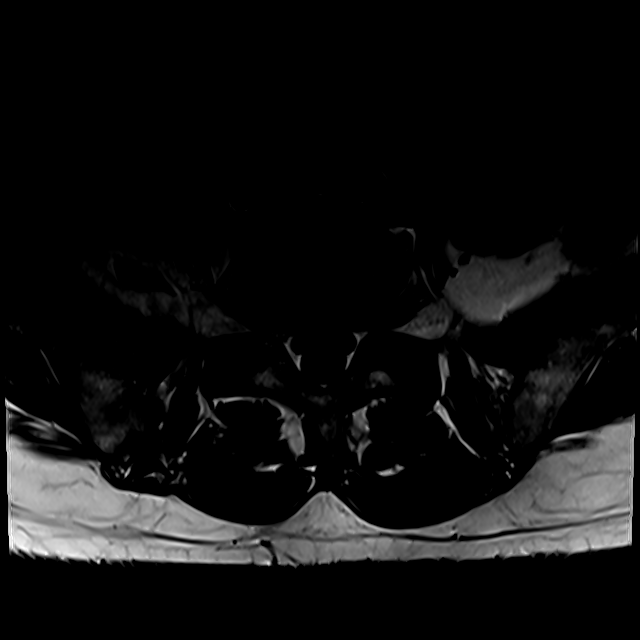
[im 20/39]
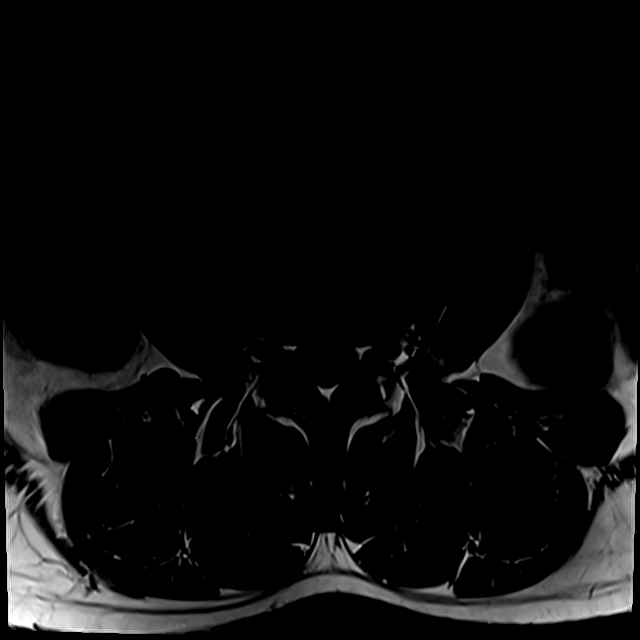
[im 33/39]
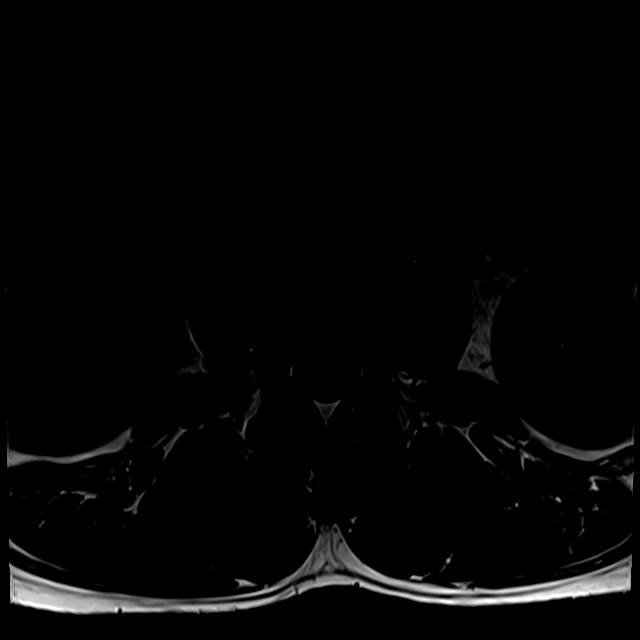

[Series 14: T2 · axial · 4.0mm · 0.28mm/px · z∈[-145,+32]mm · 7 of 39 slices shown (2 of 2)]
[im 1/39]
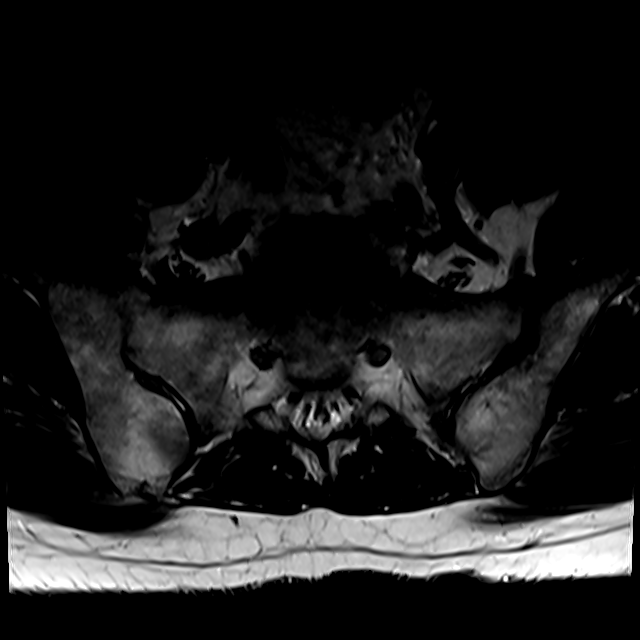
[im 6/39]
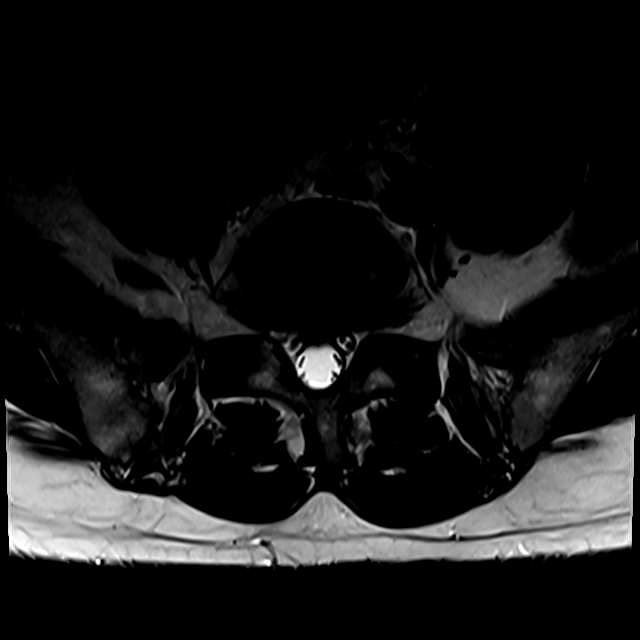
[im 11/39]
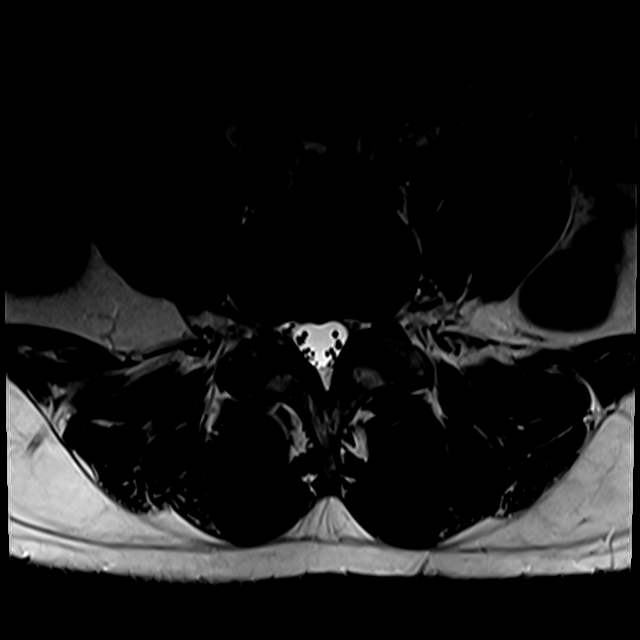
[im 17/39]
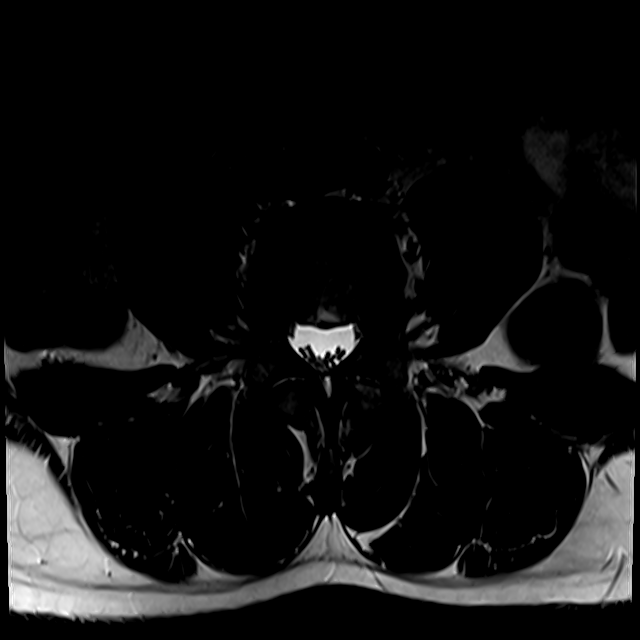
[im 20/39]
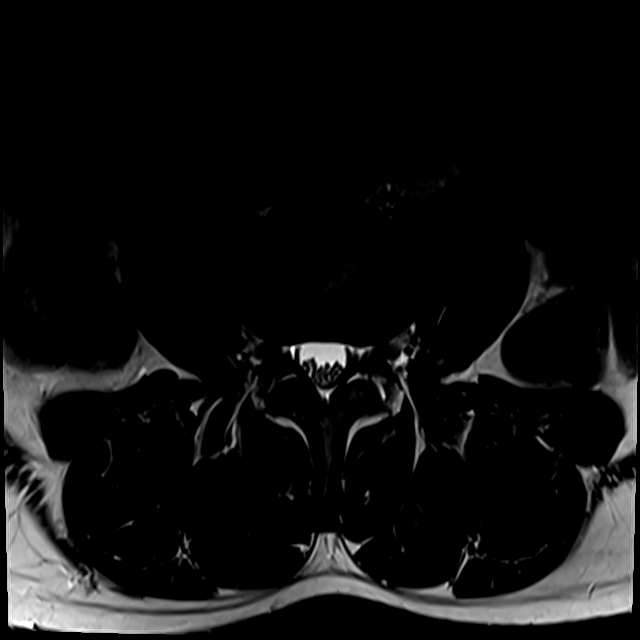
[im 22/39]
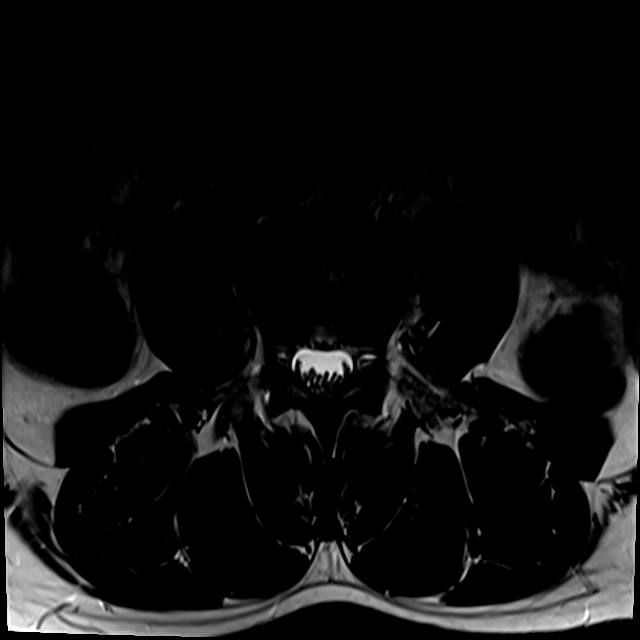
[im 33/39]
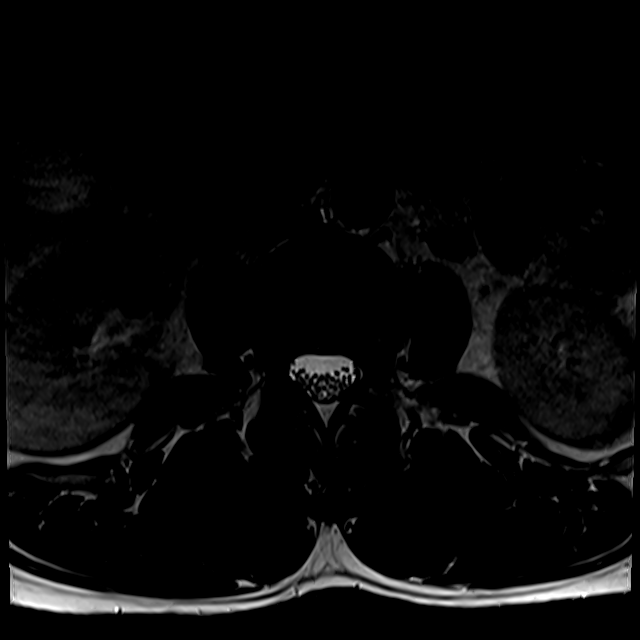

[19 of 48 positions shown; findings below may reference images not displayed]

FINDINGS: Segmentation:  Standard

Alignment:  Physiologic.

Vertebrae:  No fracture, evidence of discitis, or bone lesion.

Conus medullaris: Extends to the L1 level and appears normal.

Paraspinal and other soft tissues: Negative.

Disc levels:

L1-L2:  Normal.

L2-L3:  Normal.

L3-L4:  Normal.

L4-L5:  Normal.

L5-S1: Central and rightward protrusion. Rounded T2 hyperintense
structure with slight caudal migration, far-lateral, no more than 4
mm diameter, possible discal cyst. Reference image 6 series 8.
Slight mass effect on the RIGHT S1 nerve root. No significant
foraminal narrowing.
IMPRESSION: Central and rightward protrusion at L5-S1. 4 mm rounded T2
hyperintense structure, far lateral on the RIGHT, possible
associated discal cyst.

Remainder of the exam unremarkable.

## 2019-04-29 ENCOUNTER — Ambulatory Visit: Payer: Self-pay

## 2019-04-29 ENCOUNTER — Other Ambulatory Visit: Payer: Self-pay

## 2019-04-29 ENCOUNTER — Ambulatory Visit (INDEPENDENT_AMBULATORY_CARE_PROVIDER_SITE_OTHER): Payer: BC Managed Care – PPO | Admitting: Orthopedic Surgery

## 2019-04-29 DIAGNOSIS — M25532 Pain in left wrist: Secondary | ICD-10-CM

## 2019-04-29 MED ORDER — MELOXICAM 15 MG PO TABS: ORAL_TABLET | ORAL | 0 refills | Status: DC

## 2019-04-29 MED ORDER — PREDNISONE 5 MG (21) PO TBPK: ORAL_TABLET | ORAL | 0 refills | Status: DC

## 2019-04-30 ENCOUNTER — Encounter: Payer: Self-pay | Admitting: Orthopedic Surgery
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
## 2019-05-27 ENCOUNTER — Other Ambulatory Visit: Payer: Self-pay | Admitting: Orthopedic Surgery

## 2019-05-30 NOTE — Telephone Encounter (Signed)
Please advise. Thanks.  

## 2019-06-03 ENCOUNTER — Ambulatory Visit: Payer: BC Managed Care – PPO | Admitting: Orthopedic Surgery

## 2020-03-16 ENCOUNTER — Other Ambulatory Visit: Payer: Self-pay

## 2020-03-16 ENCOUNTER — Encounter: Payer: Self-pay | Admitting: Surgical

## 2020-03-16 ENCOUNTER — Ambulatory Visit: Payer: Self-pay

## 2020-03-16 ENCOUNTER — Ambulatory Visit (INDEPENDENT_AMBULATORY_CARE_PROVIDER_SITE_OTHER): Payer: BC Managed Care – PPO | Admitting: Surgical

## 2020-03-16 DIAGNOSIS — M545 Low back pain, unspecified: Secondary | ICD-10-CM

## 2020-03-16 MED ORDER — CELECOXIB 100 MG PO CAPS: 100.0000 mg | ORAL_CAPSULE | Freq: Two times a day (BID) | ORAL | 0 refills | Status: DC

## 2020-03-16 MED ORDER — PREDNISONE 10 MG (21) PO TBPK: ORAL_TABLET | ORAL | 0 refills | Status: DC

## 2020-03-16 MED ORDER — TRAMADOL HCL 50 MG PO TABS: 50.0000 mg | ORAL_TABLET | Freq: Two times a day (BID) | ORAL | 0 refills | Status: DC | PRN

## 2020-03-18 ENCOUNTER — Encounter: Payer: Self-pay | Admitting: Surgical

## 2020-03-18 NOTE — Progress Notes (Signed)
Office Visit Note   Patient: [name]           Date of Birth: 1981/02/15           MRN: [id number] Visit Date: 03/16/2020 Requested by: No referring provider defined for this encounter. PCP: Pcp, No  Subjective: Chief Complaint  Patient presents with  . Lower Back - Pain    HPI: [name] is a 39 y.o. male who presents to the office complaining of low back pain.  Patient is a [employer] architect.  He notes that he was loading objects into his truck when he loaded the last object and felt shooting pain across the bilateral lumbar spine.  This was about 5 days ago.  Pain comes and goes.  It has improved over the last 5 days somewhat.  He has been taking tizanidine and Tylenol without significant relief.  Last MRI of the lumbar spine was in 2018 that showed central and rightward disc protrusion at L5-S1 without any other findings.  He denies any radicular pain down his legs.  Denies any bowel/bladder incontinence or saddle anesthesia.  He has had previous ESI's in the past that have provided good relief.  .                ROS: All systems reviewed are negative as they relate to the chief complaint within the history of present illness.  Patient denies fevers or chills.  Assessment & Plan: Visit Diagnoses:  1. Low back pain, unspecified back pain laterality, unspecified chronicity, unspecified whether sciatica present     Plan: Patient is a 39 year old male who presents for evaluation of low back pain.  He had shooting pain across the low back with no radicular component since lifting an object into his truck.  This was 5 days ago and is somewhat better according to him.  However he still has persistent symptoms.  Impression is lumbar back strain.  Radiographs are negative for any acute findings.  Plan to prescribe steroid Dosepak, tramadol, Celebrex for pain relief.  He is already taking tizanidine.  No indication for MRI scan at this time.  Plan to reevaluate patient in 4 weeks.   Patient agreed with plan.  Follow-Up Instructions: No follow-ups on file.   Orders:  Orders Placed This Encounter  Procedures  . XR Lumbar Spine 2-3 Views   Meds ordered this encounter  Medications  . 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    Dispense:  30 tablet    Refill:  0  . celecoxib (CELEBREX) 100 MG capsule    Sig: Take 1 capsule (100 mg total) by mouth 2 (two) times daily.    Dispense:  60 capsule    Refill:  0  . predniSONE (STERAPRED UNI-PAK 21 TAB) 10 MG (21) TBPK tablet    Sig: TAKE AS DIRECTED    Dispense:  21 tablet    Refill:  0      Procedures: No procedures performed   Clinical Data: No additional findings.  Objective: Vital Signs: There were no vitals taken for this visit.  Physical Exam:  Constitutional: Patient appears well-developed HEENT:  Head: Normocephalic Eyes:EOM are normal Neck: Normal range of motion Cardiovascular: Normal rate Pulmonary/chest: Effort normal Neurologic: Patient is alert Skin: Skin is warm Psychiatric: Patient has normal mood and affect  Ortho Exam: Ortho exam  demonstrates lumbar spine with no axial lumbar spine tenderness.  There is paraspinal musculature tenderness on both sides of the lumbar spine, worst at about L4.  No loss of sensation throughout the dermatomes of the bilateral lower extremities.  5/5 motor strength of the bilateral hip flexors, quadricep, hamstring, dorsiflexion, plantar flexion.  Negative straight leg raise.  No evidence of clonus.  Specialty Comments:  No specialty comments available.  Imaging: No results found.   PMFS History: Patient Active Problem List   Diagnosis Date Noted  . Seizures (HCC) 08/12/2013  . 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 PNEUMONIA 02/28/2010  . Pneumothorax 02/28/2010  . Undiagnosed cardiac murmurs 02/28/2010  . PNEUMOTHORAX 02/28/2010   Past  Medical History:  Diagnosis Date  . Closed head injury    "bleeding and bruising of brain on both sides"  . Murmur   . Pelvic fracture (HCC) 2008   external fixator  . Pneumothorax, left   . Seizures (HCC)     Family History  Problem Relation Age of Onset  . Diabetes Mother   . High blood pressure Mother   . Seizures Paternal Grandmother   . Seizures Brother     Past Surgical History:  Procedure Laterality Date  . BONY PELVIS SURGERY  2008   external fixator  . KNEE SURGERY Right '08,'09,'10,'11.   4 surgeries in 4 yrs  . THORACENTESIS     Social History   Occupational History    Employer: [employer]    Comment: Part time  Tobacco Use  . Smoking status: Current Every Day Smoker    Packs/day: 0.30    Years: 10.00    Pack years: 3.00    Types: Cigarettes  . Smokeless tobacco: Never Used  Substance and Sexual Activity  . Alcohol use: Yes    Alcohol/week: 14.0 standard drinks    Types: 14 Cans of beer per week    Comment: 2 beers daily  . Drug use: No  . Sexual activity: Not on file

## 2020-03-23 ENCOUNTER — Ambulatory Visit: Payer: BC Managed Care – PPO | Admitting: Orthopedic Surgery

## 2020-03-23 ENCOUNTER — Telehealth: Payer: Self-pay

## 2020-03-23 NOTE — Telephone Encounter (Signed)
Patient was scheduled for an appointment on 04/12/2020 with Dr. Doreene Burke, but he will be out of the office. Two call attempts and letter sent.

## 2020-04-08 ENCOUNTER — Other Ambulatory Visit: Payer: Self-pay | Admitting: Surgical

## 2020-04-09 NOTE — Telephone Encounter (Signed)
Please advise. Thanks.  

## 2020-04-12 ENCOUNTER — Ambulatory Visit: Payer: BC Managed Care – PPO | Admitting: Family Medicine

## 2020-04-13 ENCOUNTER — Ambulatory Visit (INDEPENDENT_AMBULATORY_CARE_PROVIDER_SITE_OTHER): Payer: BC Managed Care – PPO | Admitting: Physical Medicine and Rehabilitation

## 2020-04-13 ENCOUNTER — Encounter: Payer: Self-pay | Admitting: Orthopedic Surgery

## 2020-04-13 ENCOUNTER — Ambulatory Visit (INDEPENDENT_AMBULATORY_CARE_PROVIDER_SITE_OTHER): Payer: BC Managed Care – PPO | Admitting: Orthopedic Surgery

## 2020-04-13 ENCOUNTER — Other Ambulatory Visit: Payer: Self-pay

## 2020-04-13 ENCOUNTER — Ambulatory Visit: Payer: Self-pay

## 2020-04-13 DIAGNOSIS — M545 Low back pain, unspecified: Secondary | ICD-10-CM

## 2020-04-13 DIAGNOSIS — G8929 Other chronic pain: Secondary | ICD-10-CM

## 2020-04-13 DIAGNOSIS — M5416 Radiculopathy, lumbar region: Secondary | ICD-10-CM | POA: Diagnosis not present

## 2020-04-13 DIAGNOSIS — M5116 Intervertebral disc disorders with radiculopathy, lumbar region: Secondary | ICD-10-CM

## 2020-04-13 MED ORDER — METHYLPREDNISOLONE ACETATE 80 MG/ML IJ SUSP
80.0000 mg | Freq: Once | INTRAMUSCULAR | Status: AC
  Administered 2020-04-13: 80 mg

## 2020-04-13 NOTE — Progress Notes (Signed)
Office Visit Note   Patient: [name]           Date of Birth: 1980-09-10           MRN: [id number] Visit Date: 04/13/2020 Requested by: No referring provider defined for this encounter. PCP: Pcp, No  Subjective: Chief Complaint  Patient presents with  . f/u    HPI: [name] is a 39 year old patient with low back pain.  Started hurting about a month ago.  Radiographs at that time were unremarkable.  Had MRI scan in 2018 which is reviewed.  That shows some disc issues at L5-S1.  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  No problem since his injection 3 years ago.  Did take some medication last appointment which did not help.  Heating pad has helped.  Denies any bowel and bladder symptoms or fevers or chills or red flag symptoms.  Not much radiation with the back pain but it staying central              ROS: All systems reviewed are negative as they relate to the chief complaint within the history of present illness.  Patient denies  fevers or chills.   Assessment & Plan: Visit Diagnoses:  1. Radiculopathy due to lumbar intervertebral disc disorder   2. Lumbar radiculopathy     Plan: Impression is low back pain.  Plan is to have Dr. Alvester Morin see him today for lumbar spine ESI.  Dr. Alvester Morin and Toni Amend were exceptionally accommodating and being able to work [employer] in for an appointment today because he is driving a cross-country trip starting on Monday.  Follow-up with me as needed.  Follow-Up Instructions: No follow-ups on file.   Orders:  No orders of the defined types were placed in this encounter.  No orders of the defined types were placed in this encounter.     Procedures: No procedures performed   Clinical Data: No additional findings.  Objective: Vital Signs: There were no vitals taken for this visit.  Physical Exam:   Constitutional: Patient appears well-developed HEENT:  Head: Normocephalic Eyes:EOM are normal Neck:  Normal range of motion Cardiovascular: Normal rate Pulmonary/chest: Effort normal Neurologic: Patient is alert Skin: Skin is warm Psychiatric: Patient has normal mood and affect    Ortho Exam: Ortho exam demonstrates no nerve root tension signs.  Patient does have pain with forward bending and extension.  5 out of 5 ankle dorsiflexion plantarflexion quad hamstring strength.  Palpable pedal pulses.  No masses lymphadenopathy or skin changes noted in that back region.  No paresthesias L1 S1 bilaterally.  No muscle atrophy in the legs.  Specialty Comments:  No specialty comments available.  Imaging: Epidural Steroid injection  Result Date: 04/13/2020 Tyrell Antonio, MD     04/13/2020 11:26 AM Lumbosacral Transforaminal Epidural Steroid Injection - Sub-Pedicular Approach with Fluoroscopic Guidance Patient: [name]     Date of Birth: 05-28-1980 MRN: [id number] PCP: Pcp, No     Visit Date: 04/13/2020  Universal Protocol:   Date/Time: 04/13/2020 Consent Given By: the patient Position: PRONE Additional Comments: Vital signs were monitored before and after the procedure. Patient was prepped and draped in the usual sterile fashion. The correct patient, procedure, and site was verified. Injection Procedure Details: Procedure diagnoses: 1. Radiculopathy due to lumbar intervertebral disc disorder  2. Chronic bilateral low back pain without sciatica   Meds Administered: Meds ordered this encounter Medications . methylPREDNISolone acetate (DEPO-MEDROL) injection  80 mg Laterality: Right Location/Site: L5-S1 Needle:5.0 in., 22 ga.  Short bevel or Quincke spinal needle Needle Placement: Transforaminal Findings:   -Comments: Excellent flow of contrast along the nerve, nerve root and into the epidural space. Procedure Details: After squaring off the end-plates to get a true AP view, the C-arm was positioned so that an oblique view of the foramen as noted above was visualized. The target area is just inferior  to the "nose of the scotty dog" or sub pedicular. The soft tissues overlying this structure were infiltrated with 2-3 ml. of 1% Lidocaine without Epinephrine. The spinal needle was inserted toward the target using a "trajectory" view along the fluoroscope beam.  Under AP and lateral visualization, the needle was advanced so it did not puncture dura and was located close the 6 O'Clock position of the pedical in AP tracterory. Biplanar projections were used to confirm position. Aspiration was confirmed to be negative for CSF and/or blood. A 1-2 ml. volume of Isovue-250 was injected and flow of contrast was noted at each level. Radiographs were obtained for documentation purposes. After attaining the desired flow of contrast documented above, a 0.5 to 1.0 ml test dose of 0.25% Marcaine was injected into each respective transforaminal space.  The patient was observed for 90 seconds post injection.  After no sensory deficits were reported, and normal lower extremity motor function was noted,   the above injectate was administered so that equal amounts of the injectate were placed at each foramen (level) into the transforaminal epidural space. Additional Comments: The patient tolerated the procedure well Dressing: 2 x 2 sterile gauze and Band-Aid  Post-procedure details: Patient was observed during the procedure. Post-procedure instructions were reviewed. Patient left the clinic in stable condition.   XR C-ARM NO REPORT  Result Date: 04/13/2020 Please see Notes tab for imaging impression.    PMFS History: Patient Active Problem List   Diagnosis Date Noted  . Seizures (HCC) 08/12/2013  . 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 PNEUMONIA 02/28/2010  . Pneumothorax 02/28/2010  . Undiagnosed cardiac murmurs 02/28/2010  . PNEUMOTHORAX 02/28/2010   Past Medical History:  Diagnosis Date  . Closed head injury    "bleeding and bruising of brain on both sides"  . Murmur   . Pelvic fracture (HCC) 2008   external  fixator  . Pneumothorax, left   . Seizures (HCC)     Family History  Problem Relation Age of Onset  . Diabetes Mother   . High blood pressure Mother   . Seizures Paternal Grandmother   . Seizures Brother     Past Surgical History:  Procedure Laterality Date  . BONY PELVIS SURGERY  2008   external fixator  . KNEE SURGERY Right '08,'09,'10,'11.   4 surgeries in 4 yrs  . THORACENTESIS     Social History   Occupational History    Employer: [employer]    Comment: Part time  Tobacco Use  . Smoking status: Current Every Day Smoker    Packs/day: 0.30    Years: 10.00    Pack years: 3.00    Types: Cigarettes  . Smokeless tobacco: Never Used  Substance and Sexual Activity  . Alcohol use: Yes    Alcohol/week: 14.0 standard drinks    Types: 14 Cans of beer per week    Comment: 2 beers daily  . Drug use: No  . Sexual activity: Not on file

## 2020-04-13 NOTE — Procedures (Signed)
Lumbosacral Transforaminal Epidural Steroid Injection - Sub-Pedicular Approach with Fluoroscopic Guidance  Patient: [name]      Date of Birth: [birth year] MRN: [id number] PCP: Pcp, No      Visit Date: 04/13/2020   Universal Protocol:    Date/Time: 04/13/2020  Consent Given By: the patient  Position: PRONE  Additional Comments: Vital signs were monitored before and after the procedure. Patient was prepped and draped in the usual sterile fashion. The correct patient, procedure, and site was verified.   Injection Procedure Details:   Procedure diagnoses:  1. Radiculopathy due to lumbar intervertebral disc disorder   2. Chronic bilateral low back pain without sciatica      Meds Administered:  Meds ordered this encounter  Medications  . methylPREDNISolone acetate (DEPO-MEDROL) injection 80 mg    Laterality: Right  Location/Site:  L5-S1  Needle:5.0 in., 22 ga.  Short bevel or Quincke spinal needle  Needle Placement: Transforaminal  Findings:    -Comments: Excellent flow of contrast along the nerve, nerve root and into the epidural space.  Procedure Details: After squaring off the end-plates to get a true AP view, the C-arm was positioned so that an oblique view of the foramen as noted above was visualized. The target area is just inferior to the "nose of the scotty dog" or sub pedicular. The soft tissues overlying this structure were infiltrated with 2-3 ml. of 1% Lidocaine without Epinephrine.  The spinal needle was inserted toward the target using a "trajectory" view along the fluoroscope beam.  Under AP and lateral visualization, the needle was advanced so it did not puncture dura and was located close the 6 O'Clock position of the pedical in AP tracterory. Biplanar projections were used to confirm position. Aspiration was confirmed to be negative for CSF and/or blood. A 1-2 ml. volume of Isovue-250 was injected and flow of contrast was noted at each level.  Radiographs were obtained for documentation purposes.   After attaining the desired flow of contrast documented above, a 0.5 to 1.0 ml test dose of 0.25% Marcaine was injected into each respective transforaminal space.  The patient was observed for 90 seconds post injection.  After no sensory deficits were reported, and normal lower extremity motor function was noted,   the above injectate was administered so that equal amounts of the injectate were placed at each foramen (level) into the transforaminal epidural space.   Additional Comments:  The patient tolerated the procedure well Dressing: 2 x 2 sterile gauze and Band-Aid    Post-procedure details: Patient was observed during the procedure. Post-procedure instructions were reviewed.  Patient left the clinic in stable condition.

## 2020-04-13 NOTE — Progress Notes (Signed)
Patient reports pain in low back. Last eSI was in 2018- right L5 TF +Driver, -BTS, -dye allergy

## 2020-04-13 NOTE — Progress Notes (Signed)
[name] - 39 y.o. male MRN [id number]  Date of birth: 10/09/1980  Office Visit Note: Visit Date: 04/13/2020 PCP: Pcp, No Referred by: No ref. provider found  Subjective: Chief Complaint  Patient presents with  . Lower Back - Pain   HPI:  [name] is a 39 y.o. male who comes in today at the request of Dr. Burnard Bunting for planned Right L5-S1 Lumbar epidural steroid injection with fluoroscopic guidance.  The patient has failed conservative care including home exercise, medications, time and activity modification.  This injection will be diagnostic and hopefully therapeutic.  Please see requesting physician notes for further details and justification.  MRI reviewed with images and spine model.  MRI reviewed in the note below.    ROS Otherwise per HPI.  Assessment & Plan: Visit Diagnoses:  1. Radiculopathy due to lumbar intervertebral disc disorder   2. Chronic bilateral low back pain without sciatica     Plan: No additional findings.   Meds & Orders:  Meds ordered this encounter  Medications  . methylPREDNISolone acetate (DEPO-MEDROL) injection 80 mg    Orders Placed This Encounter  Procedures  . XR C-ARM NO REPORT  . Epidural Steroid injection    Follow-up: Return for visit to requesting physician as needed.   Procedures: No procedures performed  Lumbosacral Transforaminal Epidural Steroid Injection - Sub-Pedicular Approach with Fluoroscopic Guidance  Patient: [name]      Date of Birth: 1980-12-30 MRN: [id number] PCP: Pcp, No      Visit Date: 04/13/2020   Universal Protocol:    Date/Time: 04/13/2020  Consent Given By: the patient  Position: PRONE  Additional Comments: Vital signs were monitored before and after the procedure. Patient was prepped and draped in the usual sterile fashion. The correct patient, procedure, and site was verified.   Injection Procedure Details:   Procedure diagnoses:  1. Radiculopathy due to lumbar  intervertebral disc disorder   2. Chronic bilateral low back pain without sciatica      Meds Administered:  Meds ordered this encounter  Medications  . methylPREDNISolone acetate (DEPO-MEDROL) injection 80 mg    Laterality: Right  Location/Site:  L5-S1  Needle:5.0 in., 22 ga.  Short bevel or Quincke spinal needle  Needle Placement: Transforaminal  Findings:    -Comments: Excellent flow of contrast along the nerve, nerve root and into the epidural space.  Procedure Details: After squaring off the end-plates to get a true AP view, the C-arm was positioned so that an oblique view of the foramen as noted above was visualized. The target area is just inferior to the "nose of the scotty dog" or sub pedicular. The soft tissues overlying this structure were infiltrated with 2-3 ml. of 1% Lidocaine without Epinephrine.  The spinal needle was inserted toward the target using a "trajectory" view along the fluoroscope beam.  Under AP and lateral visualization, the needle was advanced so it did not puncture dura and was located close the 6 O'Clock position of the pedical in AP tracterory. Biplanar projections were used to confirm position. Aspiration was confirmed to be negative for CSF and/or blood. A 1-2 ml. volume of Isovue-250 was injected and flow of contrast was noted at each level. Radiographs were obtained for documentation purposes.   After attaining the desired flow of contrast documented above, a 0.5 to 1.0 ml test dose of 0.25% Marcaine was injected into each respective transforaminal space.  The patient was observed for 90 seconds post injection.  After no sensory deficits were reported, and normal lower extremity motor function was noted,   the above injectate was administered so that equal amounts of the injectate were placed at each foramen (level) into the transforaminal epidural space.   Additional Comments:  The patient tolerated the procedure well Dressing: 2 x 2 sterile gauze  and Band-Aid    Post-procedure details: Patient was observed during the procedure. Post-procedure instructions were reviewed.  Patient left the clinic in stable condition.      Clinical History: MRI LUMBAR SPINE WITHOUT CONTRAST 09/09/2016  TECHNIQUE: Multiplanar, multisequence MR imaging of the lumbar spine was performed. No intravenous contrast was administered.  COMPARISON:  Lumbar spine radiographs 08/20/2016.  FINDINGS: Segmentation:  Standard  Alignment:  Physiologic.  Vertebrae:  No fracture, evidence of discitis, or bone lesion.  Conus medullaris: Extends to the L1 level and appears normal.  Paraspinal and other soft tissues: Negative.  Disc levels:  L1-L2:  Normal.  L2-L3:  Normal.  L3-L4:  Normal.  L4-L5:  Normal.  L5-S1: Central and rightward protrusion. Rounded T2 hyperintense structure with slight caudal migration, far-lateral, no more than 4 mm diameter, possible discal cyst. Reference image 6 series 8. Slight mass effect on the RIGHT S1 nerve root. No significant foraminal narrowing.  IMPRESSION: Central and rightward protrusion at L5-S1. 4 mm rounded T2 hyperintense structure, far lateral on the RIGHT, possible associated discal cyst.     Objective:  VS:  HT:    WT:   BMI:     BP:   HR: bpm  TEMP: ( )  RESP:  Physical Exam Constitutional:      General: He is not in acute distress.    Appearance: Normal appearance. He is not ill-appearing.  HENT:     Head: Normocephalic and atraumatic.     Right Ear: External ear normal.     Left Ear: External ear normal.  Eyes:     Extraocular Movements: Extraocular movements intact.  Cardiovascular:     Rate and Rhythm: Normal rate.     Pulses: Normal pulses.  Abdominal:     General: There is no distension.     Palpations: Abdomen is soft.  Musculoskeletal:        General: No tenderness or signs of injury.     Right lower leg: No edema.     Left lower leg: No edema.      Comments: Patient has good distal strength without clonus.  Skin:    Findings: No erythema or rash.  Neurological:     General: No focal deficit present.     Mental Status: He is alert and oriented to person, place, and time.     Sensory: No sensory deficit.     Motor: No weakness or abnormal muscle tone.     Coordination: Coordination normal.  Psychiatric:        Mood and Affect: Mood normal.        Behavior: Behavior normal.      Imaging: No results found.

## 2020-05-12 ENCOUNTER — Other Ambulatory Visit: Payer: Self-pay | Admitting: Surgical

## 2020-07-06 ENCOUNTER — Ambulatory Visit: Payer: BC Managed Care – PPO | Admitting: Family Medicine

## 2020-07-23 ENCOUNTER — Ambulatory Visit (HOSPITAL_COMMUNITY)
Admission: EM | Admit: 2020-07-23 | Discharge: 2020-07-23 | Disposition: A | Discharge status: Home / Self Care | Payer: BC Managed Care – PPO | Attending: Emergency Medicine | Admitting: Emergency Medicine

## 2020-07-23 ENCOUNTER — Encounter (HOSPITAL_COMMUNITY): Payer: Self-pay | Admitting: Emergency Medicine

## 2020-07-23 ENCOUNTER — Other Ambulatory Visit: Payer: Self-pay

## 2020-07-23 DIAGNOSIS — R1084 Generalized abdominal pain: Secondary | ICD-10-CM | POA: Insufficient documentation

## 2020-07-23 DIAGNOSIS — G43909 Migraine, unspecified, not intractable, without status migrainosus: Secondary | ICD-10-CM | POA: Diagnosis not present

## 2020-07-23 LAB — CBC WITH DIFFERENTIAL/PLATELET
Abs Immature Granulocytes: 0.02 10*3/uL (ref 0.00–0.07)
Basophils Absolute: 0.1 10*3/uL (ref 0.0–0.1)
Basophils Relative: 1 %
Eosinophils Absolute: 0.3 10*3/uL (ref 0.0–0.5)
Eosinophils Relative: 5 %
HCT: 37.5 % — ABNORMAL LOW (ref 39.0–52.0)
Hemoglobin: 11.7 g/dL — ABNORMAL LOW (ref 13.0–17.0)
Immature Granulocytes: 0 %
Lymphocytes Relative: 35 %
Lymphs Abs: 2 10*3/uL (ref 0.7–4.0)
MCH: 24.3 pg — ABNORMAL LOW (ref 26.0–34.0)
MCHC: 31.2 g/dL (ref 30.0–36.0)
MCV: 77.8 fL — ABNORMAL LOW (ref 80.0–100.0)
Monocytes Absolute: 0.6 10*3/uL (ref 0.1–1.0)
Monocytes Relative: 11 %
Neutro Abs: 2.8 10*3/uL (ref 1.7–7.7)
Neutrophils Relative %: 48 %
Platelets: 332 10*3/uL (ref 150–400)
RBC: 4.82 MIL/uL (ref 4.22–5.81)
RDW: 15.6 % — ABNORMAL HIGH (ref 11.5–15.5)
WBC: 5.9 10*3/uL (ref 4.0–10.5)
nRBC: 0 % (ref 0.0–0.2)

## 2020-07-23 MED ORDER — DICYCLOMINE HCL 20 MG PO TABS: 20.0000 mg | ORAL_TABLET | Freq: Two times a day (BID) | ORAL | 0 refills | Status: DC | PRN

## 2020-07-23 NOTE — ED Triage Notes (Signed)
Pt is present today with diarrhea, abdominal pain, and migraines.  Pt states that the diarrhea started Friday morning. Pt states that he gets migraine often but lately his migraine has been getting worse. Pt states that he does have a Neurologist.  Pt states that he takes Excedrin extra strength [phone] mg when he has a migraine.

## 2020-07-23 NOTE — Discharge Instructions (Addendum)
Recurrent migraines- continue to take Excedrin migraine (do not take more than recommended dose) as need for migraine headaches.  You may also take ibuprofen as needed.  Do not take unless you have a headache- you can cause rebound headaches if you take it too frequently.  Go to the ER if you have dizziness, loss of consciousness, the worst headache of your life, slurred speech, or weakness on one side.   Abdominal pain- drink lots of water, at least 60 oz a day (8 regular glasses).  Take miralax as needed for constipation.  Eat a BRAT (bananas, applesauce, rice, toast) or Bland food diet for a few days.   Go to the ER if you have any blood in your stool, weight loss, inability to keep any food or water down, or dizziness.     Return or go to the Emergency Department if symptoms worsen or do not improve in the next few days.
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
## 2020-08-03 ENCOUNTER — Encounter: Payer: Self-pay | Admitting: Family Medicine

## 2020-08-03 ENCOUNTER — Ambulatory Visit (INDEPENDENT_AMBULATORY_CARE_PROVIDER_SITE_OTHER): Payer: BC Managed Care – PPO | Admitting: Family Medicine

## 2020-08-03 ENCOUNTER — Other Ambulatory Visit: Payer: Self-pay

## 2020-08-03 VITALS — BP 118/70 | HR 64 | Temp 97.6°F | Ht 73.5 in | Wt 213.8 lb

## 2020-08-03 DIAGNOSIS — M5116 Intervertebral disc disorders with radiculopathy, lumbar region: Secondary | ICD-10-CM | POA: Insufficient documentation

## 2020-08-03 DIAGNOSIS — Z13220 Encounter for screening for lipoid disorders: Secondary | ICD-10-CM

## 2020-08-03 DIAGNOSIS — D560 Alpha thalassemia: Secondary | ICD-10-CM | POA: Diagnosis not present

## 2020-08-03 DIAGNOSIS — E785 Hyperlipidemia, unspecified: Secondary | ICD-10-CM | POA: Insufficient documentation

## 2020-08-03 DIAGNOSIS — Z131 Encounter for screening for diabetes mellitus: Secondary | ICD-10-CM | POA: Diagnosis not present

## 2020-08-03 DIAGNOSIS — R011 Cardiac murmur, unspecified: Secondary | ICD-10-CM

## 2020-08-03 DIAGNOSIS — G43009 Migraine without aura, not intractable, without status migrainosus: Secondary | ICD-10-CM | POA: Insufficient documentation

## 2020-08-03 DIAGNOSIS — Z1159 Encounter for screening for other viral diseases: Secondary | ICD-10-CM

## 2020-08-03 DIAGNOSIS — K5909 Other constipation: Secondary | ICD-10-CM | POA: Diagnosis not present

## 2020-08-03 DIAGNOSIS — R7303 Prediabetes: Secondary | ICD-10-CM | POA: Insufficient documentation

## 2020-08-03 LAB — LIPID PANEL
Cholesterol: 201 mg/dL — ABNORMAL HIGH (ref 0–200)
HDL: 53.7 mg/dL (ref >39.00)
LDL Cholesterol: 131 mg/dL — ABNORMAL HIGH (ref 0–99)
NonHDL: 146.87
Total CHOL/HDL Ratio: 4
Triglycerides: 78 mg/dL (ref 0.0–149.0)
VLDL: 15.6 mg/dL (ref 0.0–40.0)

## 2020-08-03 LAB — HEMOGLOBIN A1C: Hgb A1c MFr Bld: 6.2 % (ref 4.6–6.5)

## 2020-08-03 NOTE — Patient Instructions (Signed)
Thalassemia  Thalassemia is a blood disorder that causes a low level of red blood cells (anemia). This condition is passed from parent to child through abnormal genes (gene mutations). The mutations make it hard for your body to make the protein in red blood cells (hemoglobin) that carries oxygen from your lungs to the rest of your body. Red blood cells do not live long without hemoglobin. Loss of red blood cells leads to anemia, which is the main symptom of thalassemia. There are two main types of thalassemia. The type depends on which part of the hemoglobin is affected.  Alpha thalassemia affects the alpha part of the hemoglobin. This is caused by four genes. You could get two from each parent.  Beta thalassemia affects the beta part of the hemoglobin. This is caused by two genes. You could get one from each parent. Thalassemia can be mild or severe. It depends on how many gene mutations you are born with. The more gene mutations you get, the more severe the condition. A person who inherits just one gene will be a carrier of the condition (thalassemia trait). A person with thalassemia trait may not have any symptoms or may have only mild anemia. A person who inherits two or more genes can have thalassemia minor, thalassemia intermedia, or thalassemia major. Thalassemia is a lifelong condition. There is no cure, but treatment can control symptoms and manage the condition. What are the causes? Thalassemia is cause by gene mutations that are passed down through families. What increases the risk? You are more likely to develop this condition if:  You have a family history of thalassemia.  Your ancestors are from [place], [place], [place], [place], [place], or the [place]. What are the signs or symptoms? The most common signs and symptoms of thalassemia are the signs and symptoms of anemia. They include:  Weakness.  Tiredness.  Pounding heartbeat.  Dizziness.  Headache.  Leg  cramps.  Pale skin.  Confusion.  Shortness of breath. Other signs and symptoms can also occur. You may have:  Yellow eyes or skin, and dark urine (jaundice). The breakdown of red blood cells can cause a yellowing pigment (bilirubin) to build up in your blood.  Weak bones (osteoporosis) and bone fractures. This is because bones can weaken from the effort of making more hemoglobin.  An enlarged spleen. This can lead to a swollen belly. Your spleen can become enlarged from filtering dead red blood cells.  Frequent, severe infections. This occurs if your spleen and bone marrow become weak. These organs make white blood cells that your body needs to fight infections. How is this diagnosed? Your health care provider may suspect thalassemia based on your signs and symptoms, especially if you have a family history of the condition. This condition may be diagnosed:  In childhood, if you have severe forms of thalassemia. This is because symptoms show early in life.  At birth. In the [place] babies are screened for this condition.  In adulthood, if you have thalassemia trait or thalassemia minor. This happens if symptoms of anemia start or if a routine blood test shows unexplained anemia. Blood tests can confirm a thalassemia diagnosis. Blood tests may show:  Low hemoglobin.  Low iron.  Abnormal hemoglobin.  Thalassemia gene mutations. You may need to see a health care provider who specializes in blood diseases (hematologist). How is this treated? Treatment for this condition depends on the type of thalassemia that you have:  If you have thalassemia trait or thalassemia minor,  you may not need treatment. However, you may need treatment if you have thalassemia minor and you develop symptoms during an infection.  If you have thalassemia intermedia, you will have symptoms that require treatment.  If you have major thalassemia, you will have serious symptoms that require regular  treatment. Thalassemia treatment may include:  Donated blood (transfusions) to replace red blood cells.  Vitamin B (folic acid) supplements to help produce hemoglobin and red blood cells.  Medicines or injections to remove iron buildup (chelation). This can happen in people who have frequent transfusions. Iron overload can damage heart, liver, and brain cells.  In the case of severe thalassemia: ? The spleen may need to be removed if it becomes damaged. ? Stem cell or bone marrow transplants may be necessary to transplant cells that can make red blood cells. This may be done if transfusions are not working. Follow these instructions at home: Eating and drinking  Follow instructions from your health care provider about eating or drinking restrictions. You may need to avoid foods or drinks that are high in iron or fortified with iron.  Eat foods that are high in fiber, such as fresh fruits and vegetables, whole grains, and beans. Limit foods that are high in fat and processed sugars, such as fried and sweet foods. Nutrition is important for preventing anemia.   Activity  Return to your normal activities as told by your health care provider. Ask your health care provider what activities are safe for you.  Exercise is important for maintaining energy and strong bones. Ask your health care provider what amount and type of exercise is safe for you. General instructions  Take over-the-counter and prescription medicines only as told by your health care provider.  Keep all routine vaccinations and flu shots up to date to reduce your risk of infection.  Wash your hands frequently.  Do your best to avoid sick people, and stay out of crowds during cold and flu seasons.  Meet with a Dentist if you are or may become pregnant. A genetic counselor can explain the risks of passing thalassemia to a child.  Keep all follow-up visits as told by your health care provider. This is important.    Contact a health care provider if:  You have signs or symptoms of anemia.  You have a fever or other signs of infection.  Your belly is swollen.  You have jaundice. Get help right away if:  You feel very weak or short of breath. Summary  Thalassemia is a blood disorder that causes anemia.  Thalassemia can range from mild to severe.  This condition is passed down through families.  There is no cure, but treatment can manage the symptoms and prevent anemia. This information is not intended to replace advice given to you by your health care provider. Make sure you discuss any questions you have with your health care provider. Document Revised: 09/08/2017 Document Reviewed: 09/08/2017 Elsevier Patient Education  2021 [employer].
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
## 2020-08-04 LAB — HCV AB W REFLEX TO QUANT PCR: HCV Ab: 0.1 s/co ratio (ref 0.0–0.9)

## 2020-08-04 LAB — HCV INTERPRETATION

## 2020-08-05 LAB — HCV AB W REFLEX TO QUANT PCR: HCV Ab: <0.1 s/co ratio (ref 0.0–0.9)

## 2020-08-05 LAB — HCV INTERPRETATION

## 2020-08-29 ENCOUNTER — Telehealth: Payer: Self-pay | Admitting: Orthopedic Surgery

## 2020-08-29 NOTE — Telephone Encounter (Signed)
Pt states that his left calf is swollen and he wants to know if he can be worked in. [employer] [phone]

## 2020-08-29 NOTE — Telephone Encounter (Signed)
Scheduled work in appt.

## 2020-08-30 ENCOUNTER — Ambulatory Visit: Payer: Self-pay

## 2020-08-30 ENCOUNTER — Ambulatory Visit (INDEPENDENT_AMBULATORY_CARE_PROVIDER_SITE_OTHER): Payer: BC Managed Care – PPO | Admitting: Orthopedic Surgery

## 2020-08-30 ENCOUNTER — Other Ambulatory Visit: Payer: Self-pay

## 2020-08-30 ENCOUNTER — Ambulatory Visit (HOSPITAL_COMMUNITY)
Admission: RE | Admit: 2020-08-30 | Discharge: 2020-08-30 | Disposition: A | Discharge status: Home / Self Care | Payer: BC Managed Care – PPO | Source: Ambulatory Visit | Attending: Orthopedic Surgery | Admitting: Orthopedic Surgery

## 2020-08-30 DIAGNOSIS — M79605 Pain in left leg: Secondary | ICD-10-CM | POA: Insufficient documentation

## 2020-09-02 ENCOUNTER — Encounter: Payer: Self-pay | Admitting: Orthopedic Surgery
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
## 2020-09-03 ENCOUNTER — Encounter: Payer: Self-pay | Admitting: Family Medicine

## 2020-09-03 DIAGNOSIS — Z86718 Personal history of other venous thrombosis and embolism: Secondary | ICD-10-CM | POA: Insufficient documentation

## 2020-09-28 ENCOUNTER — Ambulatory Visit (INDEPENDENT_AMBULATORY_CARE_PROVIDER_SITE_OTHER): Payer: BC Managed Care – PPO | Admitting: Cardiovascular Disease

## 2020-09-28 ENCOUNTER — Other Ambulatory Visit: Payer: Self-pay

## 2020-09-28 ENCOUNTER — Encounter: Payer: Self-pay | Admitting: Cardiovascular Disease

## 2020-09-28 VITALS — BP 122/50 | HR 69 | Ht 73.5 in | Wt 205.0 lb

## 2020-09-28 DIAGNOSIS — R011 Cardiac murmur, unspecified: Secondary | ICD-10-CM | POA: Diagnosis not present

## 2020-09-28 NOTE — Patient Instructions (Signed)
Medication Instructions:  Your physician recommends that you continue on your current medications as directed. Please refer to the Current Medication list given to you today.  *If you need a refill on your cardiac medications before your next appointment, please call your pharmacy*   Lab Work: None If you have labs (blood work) drawn today and your tests are completely normal, you will receive your results only by: Marland Kitchen MyChart Message (if you have MyChart) OR . A paper copy in the mail If you have any lab test that is abnormal or we need to change your treatment, we will call you to review the results.   Testing/Procedures: Your physician has requested that you have an echocardiogram bubble study. Echocardiography is a painless test that uses sound waves to create images of your heart. It provides your doctor with information about the size and shape of your heart and how well your heart's chambers and valves are working. This procedure takes approximately one hour. There are no restrictions for this procedure.     Follow-Up: At [facility], you and your health needs are our priority.  As part of our continuing mission to provide you with exceptional heart care, we have created designated Provider Care Teams.  These Care Teams include your primary Cardiologist (physician) and Advanced Practice Providers (APPs -  Physician Assistants and Nurse Practitioners) who all work together to provide you with the care you need, when you need it.  We recommend signing up for the patient portal called "MyChart".  Sign up information is provided on this After Visit Summary.  MyChart is used to connect with patients for Virtual Visits (Telemedicine).  Patients are able to view lab/test results, encounter notes, upcoming appointments, etc.  Non-urgent messages can be sent to your provider as well.   To learn more about what you can do with MyChart, go to [email or web address].    Your next appointment:    As needed  The format for your next appointment:   In Person  Provider:   You may see Dr. Verne Carrow or one of the following Advanced Practice Providers on your designated Care Team:    Ronie Spies, PA-C  Jacolyn Reedy, PA-C    Other Instructions
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
## 2020-10-29 ENCOUNTER — Other Ambulatory Visit: Payer: Self-pay

## 2020-10-29 ENCOUNTER — Ambulatory Visit (HOSPITAL_COMMUNITY): Payer: BC Managed Care – PPO | Attending: Internal Medicine

## 2020-10-29 DIAGNOSIS — R011 Cardiac murmur, unspecified: Secondary | ICD-10-CM

## 2020-10-29 LAB — ECHOCARDIOGRAM COMPLETE BUBBLE STUDY
Area-P 1/2: 2.95 cm2
S' Lateral: 2.9 cm

## 2020-11-08 ENCOUNTER — Other Ambulatory Visit: Payer: Self-pay

## 2020-11-09 ENCOUNTER — Ambulatory Visit: Payer: BC Managed Care – PPO | Admitting: Family Medicine

## 2020-11-09 DIAGNOSIS — Z0289 Encounter for other administrative examinations: Secondary | ICD-10-CM

## 2020-11-12 ENCOUNTER — Ambulatory Visit: Payer: BC Managed Care – PPO | Admitting: Family Medicine

## 2020-11-12 ENCOUNTER — Encounter: Payer: Self-pay | Admitting: Family Medicine

## 2020-11-12 ENCOUNTER — Other Ambulatory Visit: Payer: Self-pay

## 2020-11-12 VITALS — BP 118/76 | HR 82 | Temp 97.1°F | Resp 18 | Ht 74.0 in | Wt 211.0 lb

## 2020-11-12 DIAGNOSIS — N528 Other male erectile dysfunction: Secondary | ICD-10-CM

## 2020-11-12 DIAGNOSIS — G43009 Migraine without aura, not intractable, without status migrainosus: Secondary | ICD-10-CM | POA: Diagnosis not present

## 2020-11-12 DIAGNOSIS — E785 Hyperlipidemia, unspecified: Secondary | ICD-10-CM

## 2020-11-12 DIAGNOSIS — Z23 Encounter for immunization: Secondary | ICD-10-CM

## 2020-11-12 DIAGNOSIS — N529 Male erectile dysfunction, unspecified: Secondary | ICD-10-CM | POA: Insufficient documentation

## 2020-11-12 DIAGNOSIS — R011 Cardiac murmur, unspecified: Secondary | ICD-10-CM

## 2020-11-12 DIAGNOSIS — R7303 Prediabetes: Secondary | ICD-10-CM | POA: Diagnosis not present

## 2020-11-12 MED ORDER — TADALAFIL 10 MG PO TABS: 10.0000 mg | ORAL_TABLET | ORAL | 2 refills | Status: DC | PRN

## 2020-11-12 NOTE — Progress Notes (Signed)
[facility] [address] [place] [address] Dept: [phone] Dept Fax: [phone]  Chronic Care Office Visit  Subjective:    Patient ID: Merric Yost II, male    DOB: 03-Nov-1980, 40 y.o..   MRN: [id number]  Chief Complaint  Patient presents with   Follow-up    Has not seen Neurology yet but has seen Cardiologist     History of Present Illness:  Patient is in today for reassessment of chronic medical issues.  Mr. Clemons has been referred to neurology related to his migraine headaches. He notes that he has been unable to be seen yet, as he had an unpaid balance on his bill at their office. He anticipates that he can address this within 2 weeks. He notes that he is managing with his headaches for now. He had been on amitriptyline at bedtime for headache prophylaxis, but apparently has stopped takign this..  Mr. Stief was seen by Dr. Clifton James (Cardiology) since his last visit for assessment of a past history of a cardiac murmur and a septal defect. He notes his echocardiogram and bubble study were normal, so no current sign of disease.  Mr. Goren was seen by Dr. August Saucer (orthopedics) in regards to a calf injury. There was no evidence of DVT, despite his past history with this. He also follows with Dr. August Saucer related to lumbar disc issues and radiculopathy. He had been managed on gabapentin, but apaprently has stopped this..  Mr. Whitby notes that for at least a decade, he has had issues with his sexual performance. He notes that he tends to ejaculate quickly and then cannot maintain an erection as long as he would like. He finds that when he drinks alcohol, he can maintain an erection, but then is unable to ejaculate. He wonders abotu approaches to help with this.  Past Medical History: Patient Active Problem List   Diagnosis Date Noted   Erectile dysfunction 11/12/2020   History of deep venous thrombosis of RUE 09/03/2020    Migraine headache without aura 08/03/2020   Chronic constipation 08/03/2020   Radiculopathy due to lumbar intervertebral disc disorder 08/03/2020   Prediabetes 08/03/2020   Borderline hyperlipidemia 08/03/2020   Seizures (HCC) 08/12/2013   Alpha thalassemia (HCC) 02/28/2010   Pneumothorax 02/28/2010   Undiagnosed cardiac murmurs 02/28/2010   Past Surgical History:  Procedure Laterality Date   BONY PELVIS SURGERY  2008   external fixator   KNEE SURGERY Right '08,'09,'10,'11.   4 surgeries in 4 yrs   THORACENTESIS     Family History  Problem Relation Age of Onset   High blood pressure Mother    Seizures Brother    Seizures Son    Seizures Son    Outpatient Medications Prior to Visit  Medication Sig Dispense Refill   aspirin-acetaminophen-caffeine (EXCEDRIN MIGRAINE) 250-250-65 MG tablet Take by mouth every 6 (six) hours as needed for headache.     dicyclomine (BENTYL) 20 MG tablet Take 1 tablet (20 mg total) by mouth 2 (two) times daily as needed for spasms. 20 tablet 0   tizanidine (ZANAFLEX) 6 MG capsule Take 6 mg by mouth as needed for muscle spasms.     amitriptyline (ELAVIL) 50 MG tablet Take 50 mg by mouth as needed for sleep. (Patient not taking: Reported on 11/12/2020)     gabapentin (NEURONTIN) 100 MG capsule Take 100 mg by mouth as needed. (Patient not taking: Reported on 11/12/2020)     No facility-administered medications prior to visit.  No Known Allergies    Objective:   Today's Vitals   11/12/20 0813  BP: 118/76  Pulse: 82  Resp: 18  Temp: (!) 97.1 F (36.2 C)  SpO2: 98%  Weight: 211 lb (95.7 kg)  Height: 6\' 2"  (1.88 m)   Body mass index is 27.09 kg/m.   General: Well developed, well nourished. No acute distress. Psych: Alert and oriented. Normal mood and affect.  Health Maintenance Due  Topic Date Due   Pneumococcal Vaccine 41-72 Years old (1 - PCV) Never done   TETANUS/TDAP  Never done   COVID-19 Vaccine (3 - Booster for Moderna series)  02/12/2020   Lab results: Lab Results  Component Value Date   CHOL 201 (H) 08/03/2020   HDL 53.70 08/03/2020   LDLCALC 131 (H) 08/03/2020   TRIG 78.0 08/03/2020   CHOLHDL 4 08/03/2020   Lab Results  Component Value Date   HGBA1C 6.2 08/03/2020   Imaging Echocardiogram with bubble study (10/29/2020) IMPRESSIONS    1. Left ventricular ejection fraction, by estimation, is 60 to 65%. The left ventricle has normal function. The left ventricle has no regional wall motion abnormalities. Left ventricular diastolic parameters were normal.   2. Right ventricular systolic function is normal. The right ventricular size is normal. Tricuspid regurgitation signal is inadequate for assessing PA pressure.   3. The mitral valve is normal in structure. Trivial mitral valve regurgitation. No evidence of mitral stenosis.   4. The aortic valve is tricuspid. Aortic valve regurgitation is not visualized. No aortic stenosis is present.   5. The inferior vena cava is normal in size with greater than 50% respiratory variability, suggesting right atrial pressure of 3 mmHg.   6. Agitated saline contrast bubble study was negative, with no evidence of any interatrial shunt.     Assessment & Plan:   1. Migraine without aura and without status migrainosus, not intractable Awaiting reassessment with neurology. I will hold off on starting anything new as a migraine prophylaxis, deferring to neurology.  2. Undiagnosed cardiac murmurs The echocardiogram was normal. There is no apparent underlying heart disease.  3. Prediabetes Discussed with Mr. Carlile about the results off his HbA1c. He notes this has been present for quite some time. We discussed maintaining a healthy weight and keeping physically active to reduce the risk for progression to diabetes.  4. Borderline hyperlipidemia I recommended regular physical activity. I also recommend he start taking  fish oil supplement to potentially improve his  dyslipidemia.  5. Other male erectile dysfunction Mr. Prather appears to have more of an issue with premature ejaculation. We discussed the use of topical desensitizing products to see if this will prolong the sexual act for him. We also will give a trial of a PDE-5 I to see if this helps maintaining an erection.  - tadalafil (CIALIS) 10 MG tablet; Take 1 tablet (10 mg total) by mouth every other day as needed for erectile dysfunction.  Dispense: 5 tablet; Refill: 2  6. Need for prophylactic vaccination with combined diphtheria-tetanus-pertussis (DTP) vaccine  - Tdap vaccine greater than or equal to 7yo IM  Montez Morita, MD

## 2020-11-12 NOTE — Patient Instructions (Signed)
Take fish oil supplement (omega-3 fatty acid) for heart disease prevention. Use male desensitizing cream, wipe, or spray (such as "K-Y Duration Spray") as needed to reduce premature ejaculation.

## 2021-02-15 ENCOUNTER — Ambulatory Visit: Payer: BC Managed Care – PPO | Admitting: Family Medicine

## 2021-02-28 ENCOUNTER — Other Ambulatory Visit: Payer: Self-pay

## 2021-03-01 ENCOUNTER — Ambulatory Visit (INDEPENDENT_AMBULATORY_CARE_PROVIDER_SITE_OTHER): Payer: BC Managed Care – PPO | Admitting: Family Medicine

## 2021-03-01 ENCOUNTER — Encounter: Payer: Self-pay | Admitting: Family Medicine

## 2021-03-01 VITALS — BP 130/76 | HR 86 | Temp 97.9°F | Ht 74.0 in | Wt 209.2 lb

## 2021-03-01 DIAGNOSIS — N528 Other male erectile dysfunction: Secondary | ICD-10-CM

## 2021-03-01 DIAGNOSIS — G43009 Migraine without aura, not intractable, without status migrainosus: Secondary | ICD-10-CM

## 2021-03-01 DIAGNOSIS — Z113 Encounter for screening for infections with a predominantly sexual mode of transmission: Secondary | ICD-10-CM

## 2021-03-01 NOTE — Progress Notes (Signed)
[facility] [address] [place] [address] Dept: [phone] Dept Fax: [phone]  Chronic Care Office Visit  Subjective:    Patient ID: [name], male    DOB: 1981-04-29, 40 y.o..   MRN: [id number]  Chief Complaint  Patient presents with   Follow-up    3 month f/u, wants to STD testing done.  Declines flu shot.      History of Present Illness:  Patient is in today for reassessment of chronic medical issues.  [name] has a history of migraine headaches. He notes that these have been doing well more recently. Although he has had some minor headaches, these have not progressed to more full blown migraines.  At his last visit, we addressed erectile issues. I started [name] on Cialis. He feels this does help, though he notes the expense limits how much he can use the medication. He also notes that he has a problem with ejaculating sooner than he would like. He notes he seems to have increased sensitivity.  [name] does request testing for STDs. He states he does not have significant concerns with his current partner, but likes to have periodic tests to keep on top of this issue and to be able to show future partners that he is STD free.  Past Medical History: Patient Active Problem List   Diagnosis Date Noted   Erectile dysfunction 11/12/2020   History of deep venous thrombosis of RUE 09/03/2020   Migraine headache without aura 08/03/2020   Chronic constipation 08/03/2020   Radiculopathy due to lumbar intervertebral disc disorder 08/03/2020   Prediabetes 08/03/2020   Borderline hyperlipidemia 08/03/2020   Seizures (HCC) 08/12/2013   Alpha thalassemia (HCC) 02/28/2010   Pneumothorax 02/28/2010   Past Surgical History:  Procedure Laterality Date   BONY PELVIS SURGERY  2008   external fixator   KNEE SURGERY Right '08,'09,'10,'11.   4 surgeries in 4 yrs   THORACENTESIS     Family History  Problem  Relation Age of Onset   High blood pressure Mother    Seizures Brother    Seizures Son    Seizures Son     Outpatient Medications Prior to Visit  Medication Sig Dispense Refill   aspirin-acetaminophen-caffeine (EXCEDRIN MIGRAINE) 250-250-65 MG tablet Take by mouth every 6 (six) hours as needed for headache.     tadalafil (CIALIS) 10 MG tablet Take 1 tablet (10 mg total) by mouth every other day as needed for erectile dysfunction. 5 tablet 2   dicyclomine (BENTYL) 20 MG tablet Take 1 tablet (20 mg total) by mouth 2 (two) times daily as needed for spasms. (Patient not taking: Reported on 03/01/2021) 20 tablet 0   tizanidine (ZANAFLEX) 6 MG capsule Take 6 mg by mouth as needed for muscle spasms.     No facility-administered medications prior to visit.   No Known Allergies    Objective:   Today's Vitals   03/01/21 1530  BP: 130/76  Pulse: 86  Temp: 97.9 F (36.6 C)  TempSrc: Temporal  SpO2: 96%  Weight: 209 lb 3.2 oz (94.9 kg)  Height: 6\' 2"  (1.88 m)   Body mass index is 26.86 kg/m.   General: Well developed, well nourished. No acute distress. Psych: Alert and oriented. Normal mood and affect.  Health Maintenance Due  Topic Date Due   COVID-19 Vaccine (3 - Booster for Moderna series) 02/12/2020   INFLUENZA VACCINE  Never done     Assessment & Plan:  1. Migraine without aura and without status migrainosus, not intractable Stable currently. We will continue to monitor these.  2. Other male erectile dysfunction We discussed approaches to reducing penile sensitivity (desensitizing lotions, condom use).  3. Screening for STDs (sexually transmitted diseases) I will screen for STDs today.  - Chlamydia/GC NAA, Confirmation - Hepatitis B surface antigen - HIV Antibody (routine testing w rflx) - RPR  Loyola Mast, MD

## 2021-03-02 LAB — HIV ANTIBODY (ROUTINE TESTING W REFLEX): HIV Screen 4th Generation wRfx: NONREACTIVE

## 2021-03-02 LAB — HEPATITIS B SURFACE ANTIGEN: Hepatitis B Surface Ag: NEGATIVE

## 2021-03-02 LAB — RPR: RPR Ser Ql: NONREACTIVE

## 2021-03-07 LAB — CHLAMYDIA/GC NAA, CONFIRMATION
Chlamydia trachomatis, NAA: NEGATIVE
Neisseria gonorrhoeae, NAA: NEGATIVE

## 2021-03-29 ENCOUNTER — Ambulatory Visit
Admission: RE | Admit: 2021-03-29 | Discharge: 2021-03-29 | Disposition: A | Discharge status: Home / Self Care | Payer: Worker's Compensation | Source: Ambulatory Visit | Attending: Occupational Medicine | Admitting: Occupational Medicine

## 2021-03-29 ENCOUNTER — Other Ambulatory Visit: Payer: Self-pay | Admitting: Occupational Medicine

## 2021-03-29 ENCOUNTER — Other Ambulatory Visit: Payer: Self-pay

## 2021-03-29 DIAGNOSIS — M79642 Pain in left hand: Secondary | ICD-10-CM

## 2021-09-06 ENCOUNTER — Encounter: Payer: Self-pay | Admitting: Family Medicine

## 2021-09-06 ENCOUNTER — Ambulatory Visit: Payer: BC Managed Care – PPO | Admitting: Family Medicine

## 2021-09-06 VITALS — BP 130/74 | HR 89 | Temp 97.9°F | Ht 74.0 in | Wt 218.0 lb

## 2021-09-06 DIAGNOSIS — G43009 Migraine without aura, not intractable, without status migrainosus: Secondary | ICD-10-CM | POA: Diagnosis not present

## 2021-09-06 MED ORDER — PROPRANOLOL HCL 20 MG PO TABS: 20.0000 mg | ORAL_TABLET | Freq: Two times a day (BID) | ORAL | 11 refills | Status: DC

## 2021-09-06 MED ORDER — ZOLMITRIPTAN 2.5 MG PO TABS: 2.5000 mg | ORAL_TABLET | Freq: Once | ORAL | 2 refills | Status: DC

## 2021-09-06 NOTE — Progress Notes (Signed)
?[facility] ?[facility] ?[place] ?[place] [address] ?Dept: [phone] ?Dept Fax: [phone] ? ?Chronic Care Office Visit ? ?Subjective:  ? ? Patient ID: [name], male    DOB: 26-Dec-1980, 41 y.o..   MRN: [id number] ? ?Chief Complaint  ?Patient presents with  ? Follow-up  ?  6 month f/u, not fasting today.   ? ? ?History of Present Illness: ? ?Patient is in today for reassessment of chronic medical issues. ? ?[name] has a history of migraine headaches. These have been occurring more frequently in the past 2 months. He is now having migraines about twice a week.  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 He finds bright sunlight to be a trigger for headaches for him. He notes he has tinting added to his personal vehicle's windows to reduce this. He is concerned that he could be pulled over for the level of tinting that he uses. He has used Excedrin Migraine to try and treat acute migraine events, but finds that he has to take up to 4 tablets and this does not always work. He is concerned about possibly being on something that would make him drowsy. ? ?Past Medical History: ?Patient Active Problem List  ? Diagnosis Date Noted  ? Erectile dysfunction 11/12/2020  ? History of deep venous thrombosis of RUE 09/03/2020  ? Migraine headache without aura 08/03/2020  ? Chronic constipation 08/03/2020  ? Radiculopathy due to lumbar intervertebral disc disorder 08/03/2020  ? Prediabetes 08/03/2020  ? Borderline hyperlipidemia 08/03/2020  ? Seizures ([place] [place]) 08/12/2013  ? Alpha thalassemia ([facility]) 02/28/2010  ? Pneumothorax 02/28/2010  ? ?Past Surgical History:  ?Procedure Laterality Date  ? BONY PELVIS SURGERY  2008  ? external fixator  ? KNEE SURGERY Right '08,'09,'10,'11.  ? 4 surgeries in 4 yrs  ? THORACENTESIS    ? ?Family History  ?Problem Relation Age of Onset  ? High blood pressure Mother   ? Seizures Brother   ? Seizures Son   ? Seizures Son   ? ?Outpatient Medications  Prior to Visit  ?Medication Sig Dispense Refill  ? aspirin-acetaminophen-caffeine (EXCEDRIN MIGRAINE) 250-250-65 MG tablet Take by mouth every 6 (six) hours as needed for headache.    ? Yohimbe Bark (YOHIMBE PO) Take by mouth.    ? tadalafil (CIALIS) 10 MG tablet Take 1 tablet (10 mg total) by mouth every other day as needed for erectile dysfunction. (Patient not taking: Reported on 09/06/2021) 5 tablet 2  ? ?No facility-administered medications prior to visit.  ? ?No Known Allergies ?   ?Objective:  ? ?Today's Vitals  ? 09/06/21 0934  ?BP: 130/74  ?Pulse: 89  ?Temp: 97.9 ?F (36.6 ?C)  ?TempSrc: Temporal  ?SpO2: 98%  ?Weight: 218 lb (98.9 kg)  ?Height: 6\' 2"  (1.88 m)  ? ?Body mass index is 27.99 kg/m?.  ? ?General: Well developed, well nourished. No acute distress. ?Psych: Alert and oriented. Normal mood and affect. ? ?There are no preventive care reminders to display for this patient. ?   ?Assessment & Plan:  ? ?1. Migraine without aura and without status migrainosus, not intractable ?I will start Mr. comar on a beta-blocker to try and reduce migraine frequency. I will provide Zomig for acute migraine treatment. I will provide him a letter regarding his tinted windows.  I will plan to see him back in 6 weeks to reassess. ? ?- propranolol (INDERAL) 20 MG tablet; Take 1 tablet (20 mg total) by mouth 2 (two) times  daily.  Dispense: 60 tablet; Refill: 11 ?- ZOLMitriptan (ZOMIG) 2.5 MG tablet; Take 1 tablet (2.5 mg total) by mouth once for 1 dose. May repeat in 2 hours if headache persists or recurs.  Dispense: 10 tablet; Refill: 2 ? ?Return in about 6 weeks (around 10/18/2021) for Reassessment.  ? ?Haydee Salter, MD ?

## 2021-10-14 ENCOUNTER — Encounter: Payer: Self-pay | Admitting: Family Medicine

## 2021-10-14 ENCOUNTER — Ambulatory Visit: Payer: BC Managed Care – PPO | Admitting: Family Medicine

## 2021-10-14 VITALS — BP 132/84 | HR 66 | Temp 97.2°F | Ht 74.0 in | Wt 218.8 lb

## 2021-10-14 DIAGNOSIS — R002 Palpitations: Secondary | ICD-10-CM

## 2021-10-14 DIAGNOSIS — G43009 Migraine without aura, not intractable, without status migrainosus: Secondary | ICD-10-CM

## 2021-10-14 MED ORDER — PROPRANOLOL HCL 20 MG PO TABS: 20.0000 mg | ORAL_TABLET | Freq: Two times a day (BID) | ORAL | 3 refills | Status: DC

## 2021-10-14 NOTE — Progress Notes (Signed)
[facility] [address] [place] [address] Dept: [phone] Dept Fax: [phone]  Chronic Care Office Visit  Subjective:    Patient ID: [name], male    DOB: [birth year], 41 y.o..   MRN: [id number]  Chief Complaint  Patient presents with   Follow-up    6 week f/u,     History of Present Illness:  Patient is in today for reassessment of chronic medical issues.  [name] has a history of migraine headaches. At his last visit in April, he noted these occurring about twice a week.  We started him on propranolol 20 mg bid for prevention and provided Zomig for acute migraine treatment. He notes in the past 5 weeks, he has only had one migraine for which he needed to take his rescue medicine. He is pleased with the response.  [name] notes for some years, that he has episodic palpitations. He notes his heart rate becomes very rapid. Sometimes this occurs for just a few brief seconds. Other times this is more sustained. He denies significant use of caffeine, decongestants, or other stimulants. He states the episodes are very erratic, but can be bothersome.  Past Medical History: Patient Active Problem List   Diagnosis Date Noted   Erectile dysfunction 11/12/2020   History of deep venous thrombosis of RUE 09/03/2020   Migraine headache without aura 08/03/2020   Chronic constipation 08/03/2020   Radiculopathy due to lumbar intervertebral disc disorder 08/03/2020   Prediabetes 08/03/2020   Borderline hyperlipidemia 08/03/2020   Seizures ([place]) 08/12/2013   Alpha thalassemia ([facility]) 02/28/2010   Pneumothorax 02/28/2010   Past Surgical History:  Procedure Laterality Date   BONY PELVIS SURGERY  2008   external fixator   KNEE SURGERY Right '08,'09,'10,'11.   4 surgeries in 4 yrs   THORACENTESIS     Family History  Problem Relation Age of Onset   High blood pressure Mother    Seizures Brother    Seizures Son     Seizures Son    Outpatient Medications Prior to Visit  Medication Sig Dispense Refill   aspirin-acetaminophen-caffeine (EXCEDRIN MIGRAINE) 250-250-65 MG tablet Take by mouth every 6 (six) hours as needed for headache.     magnesium oxide (MAG-OX) 400 MG tablet Take 200 mg by mouth daily.     Yohimbe Bark (YOHIMBE PO) Take by mouth.     propranolol (INDERAL) 20 MG tablet Take 1 tablet (20 mg total) by mouth 2 (two) times daily. 60 tablet 11   ZOLMitriptan (ZOMIG) 2.5 MG tablet Take 1 tablet (2.5 mg total) by mouth once for 1 dose. May repeat in 2 hours if headache persists or recurs. 10 tablet 2   No facility-administered medications prior to visit.    No Known Allergies    Objective:   Today's Vitals   10/14/21 1105  BP: 132/84  Pulse: 66  Temp: (!) 97.2 F (36.2 C)  TempSrc: Temporal  SpO2: 99%  Weight: 218 lb 12.8 oz (99.2 kg)  Height: 6\' 2"  (1.88 m)   Body mass index is 28.09 kg/m.   General: Well developed, well nourished. No acute distress. CV: RRR without murmurs or rubs. Pulses 2+ bilaterally. Psych: Alert and oriented. Normal mood and affect.  Health Maintenance Due  Topic Date Due   COVID-19 Vaccine (3 - Booster for Moderna series) 11/07/2019     Assessment & Plan:   1. Migraine without aura and without status migrainosus, not intractable Good response to  use of propranolol for migraine prevention. I will plan to continue this. Reassess in 6 months.  - propranolol (INDERAL) 20 MG tablet; Take 1 tablet (20 mg total) by mouth 2 (two) times daily.  Dispense: 180 tablet; Refill: 3  2. Palpitations No evidence of tachycardia or irregular rhythm on exam today. I will refer him back to his cardiologist to consider a Zio patch to evaluate for any significant dysrhythmia.  - Ambulatory referral to Cardiology   Return in about 6 months (around 04/16/2022) for Reassessment.   Haydee Salter, MD

## 2021-10-15 ENCOUNTER — Telehealth: Payer: Self-pay | Admitting: Cardiovascular Disease

## 2021-10-15 ENCOUNTER — Ambulatory Visit (INDEPENDENT_AMBULATORY_CARE_PROVIDER_SITE_OTHER): Payer: BC Managed Care – PPO

## 2021-10-15 DIAGNOSIS — R002 Palpitations: Secondary | ICD-10-CM

## 2021-10-15 NOTE — Progress Notes (Unsigned)
Enrolled for [employer] to mail a ZIO XT long term holter monitor to the patients address on file.  

## 2021-10-15 NOTE — Telephone Encounter (Signed)
Zio order placed for 14 day monitor.

## 2021-10-15 NOTE — Telephone Encounter (Signed)
Called pt and made him aware of order for zio patch.  Pt is aware monitor and instructions will come to his home address in 3 t o 5 days along with instructions.  He will contact the office if any questions or concerns.

## 2021-10-15 NOTE — Telephone Encounter (Signed)
Patient has a referral for palpitations in the workqueue from his PCP whom he saw yesterday. He saw Dr. Clifton James on 09/28/21. Per Dr. Vickii Penna notes, the patient is being referred back to our office to consider a heart monitor. Does he need another appointment or should an order just be placed for the monitor?

## 2021-10-20 DIAGNOSIS — R002 Palpitations: Secondary | ICD-10-CM

## 2022-02-03 ENCOUNTER — Telehealth: Payer: Self-pay | Admitting: Family Medicine

## 2022-02-03 NOTE — Telephone Encounter (Signed)
Pt is needing a form filled out from the [employer]. Of [employer] by Dr.Rudd. I have placed form up front In his folder. Pt would like a cb when completed, he will come by and pick up. Pt's # [phone]

## 2022-02-04 NOTE — Telephone Encounter (Signed)
Patient notified VIA phone and will come by to pick up form.  Form place up front. Dm/cma

## 2022-02-21 ENCOUNTER — Telehealth: Payer: Self-pay | Admitting: Family Medicine

## 2022-02-21 NOTE — Telephone Encounter (Signed)
Form placed on providers desk to be filled out upon returning on 02/24/22. Dm/cma

## 2022-02-21 NOTE — Telephone Encounter (Signed)
Caller Name: Jasiyah Paulding Call back phone #: [phone]  Reason for Call: Pt dropped off form, the first one was rejected due to being "ineligible." I have dropped in Dr. Juliane Lack folder.

## 2022-02-24 NOTE — Telephone Encounter (Signed)
Patient notified [facility] phone.   And will come pick up today. Dm/cma

## 2022-04-14 ENCOUNTER — Ambulatory Visit: Payer: BC Managed Care – PPO | Admitting: Family Medicine

## 2022-04-14 ENCOUNTER — Encounter: Payer: Self-pay | Admitting: Family Medicine

## 2022-04-14 VITALS — BP 130/84 | HR 78 | Temp 97.9°F | Ht 74.0 in | Wt 214.6 lb

## 2022-04-14 DIAGNOSIS — G43009 Migraine without aura, not intractable, without status migrainosus: Secondary | ICD-10-CM

## 2022-04-14 MED ORDER — PROPRANOLOL HCL 40 MG PO TABS: 40.0000 mg | ORAL_TABLET | Freq: Two times a day (BID) | ORAL | 3 refills | Status: DC

## 2022-04-14 MED ORDER — ZOLMITRIPTAN 5 MG PO TABS: ORAL_TABLET | ORAL | 5 refills | Status: DC

## 2022-04-14 NOTE — Progress Notes (Signed)
[facility] [address] [place] [address] Dept: [phone] Dept Fax: [phone]  Chronic Care Office Visit  Subjective:    Patient ID: [name], male    DOB: [birth year], 41 y.o..   MRN: [id number]  Chief Complaint  Patient presents with   Follow-up    6 month f/u. f    History of Present Illness:  Patient is in today for reassessment of chronic medical issues.  [name] has a history of migraine headaches. He is managed on propranolol 20 mg bid for prevention and Zomig 2.5 mg for acute migraine treatment. This regimen had reduced his headaches significantly. He did have a more major migraine 2 weeks ago. This headache last for 48 hours and did not resolve easily with the Zomig. He notes he took some additional propranolol on his own to help resolve this. He wonders if he may need to titrate up on his dose.  Past Medical History: Patient Active Problem List   Diagnosis Date Noted   Erectile dysfunction 11/12/2020   History of deep venous thrombosis of RUE 09/03/2020   Migraine headache without aura 08/03/2020   Chronic constipation 08/03/2020   Radiculopathy due to lumbar intervertebral disc disorder 08/03/2020   Prediabetes 08/03/2020   Borderline hyperlipidemia 08/03/2020   Seizures ([facility]) 08/12/2013   Alpha thalassemia ([facility]) 02/28/2010   Pneumothorax 02/28/2010   Past Surgical History:  Procedure Laterality Date   BONY PELVIS SURGERY  2008   external fixator   KNEE SURGERY Right '08,'09,'10,'11.   4 surgeries in 4 yrs   THORACENTESIS     Family History  Problem Relation Age of Onset   High blood pressure Mother    Seizures Brother    Seizures Son    Seizures Son    Outpatient Medications Prior to Visit  Medication Sig Dispense Refill   aspirin-acetaminophen-caffeine (EXCEDRIN MIGRAINE) 250-250-65 MG tablet Take by mouth every 6 (six) hours as needed for headache.     magnesium oxide  (MAG-OX) 400 MG tablet Take 200 mg by mouth daily.     Yohimbe Bark (YOHIMBE PO) Take by mouth.     propranolol (INDERAL) 20 MG tablet Take 1 tablet (20 mg total) by mouth 2 (two) times daily. 180 tablet 3   ZOLMitriptan (ZOMIG) 2.5 MG tablet Take 1 tablet (2.5 mg total) by mouth once for 1 dose. May repeat in 2 hours if headache persists or recurs. 10 tablet 2   No facility-administered medications prior to visit.   No Known Allergies    Objective:   Today's Vitals   04/14/22 1057  BP: 130/84  Pulse: 78  Temp: 97.9 F (36.6 C)  TempSrc: Temporal  SpO2: 97%  Weight: 214 lb 9.6 oz (97.3 kg)  Height: 6\' 2"  (1.88 m)   Body mass index is 27.55 kg/m.   General: Well developed, well nourished. No acute distress. Psych: Alert and oriented. Normal mood and affect.  There are no preventive care reminders to display for this patient.    Assessment & Plan:   1. Migraine without aura and without status migrainosus, not intractable We will try titrating up on both the propranolol dose and his Zomig to see if we can better address the ore major headaches [name] experiences.   - propranolol (INDERAL) 40 MG tablet; Take 1 tablet (40 mg total) by mouth 2 (two) times daily.  Dispense: 180 tablet; Refill: 3 - zolmitriptan (ZOMIG) 5 MG tablet; Take 1 tablet (5  mg total) by mouth as needed for migraine or headache. May repeat in 2 hours if headache persists or recurs.  Dispense: 10 tablet; Refill: 5   Return in about 6 months (around 10/13/2022) for Reassessment.   Loyola Mast, MD

## 2022-05-17 ENCOUNTER — Ambulatory Visit
Admission: EM | Admit: 2022-05-17 | Discharge: 2022-05-17 | Disposition: A | Discharge status: Home / Self Care | Payer: BC Managed Care – PPO | Attending: Urgent Care | Admitting: Urgent Care

## 2022-05-17 DIAGNOSIS — U071 COVID-19: Secondary | ICD-10-CM | POA: Insufficient documentation

## 2022-05-17 LAB — SARS CORONAVIRUS 2 (TAT 6-24 HRS): SARS Coronavirus 2: POSITIVE — AB

## 2022-05-17 MED ORDER — PROMETHAZINE-DM 6.25-15 MG/5ML PO SYRP: 2.5000 mL | ORAL_SOLUTION | Freq: Three times a day (TID) | ORAL | 0 refills | Status: DC | PRN

## 2022-05-17 MED ORDER — PAXLOVID (300/100) 20 X 150 MG & 10 X 100MG PO TBPK: ORAL_TABLET | ORAL | 0 refills | Status: DC

## 2022-05-17 NOTE — Discharge Instructions (Addendum)
We will manage this as a viral illness. For sore throat or cough try using a honey-based tea. Use 3 teaspoons of honey with juice squeezed from half lemon. Place shaved pieces of ginger into 1/2-1 cup of water and warm over stove top. Then mix the ingredients and repeat every 4 hours as needed. Please take ibuprofen 600mg  every 6 hours with food alternating with OR taken together with Tylenol 500mg -650mg  every 6 hours for throat pain, fevers, aches and pains. Hydrate very well with at least 2 liters of water. Eat light meals such as soups (chicken and noodles, vegetable, chicken and wild rice).  Do not eat foods that you are allergic to.  Taking an antihistamine like Zyrtec can help against postnasal drainage, sinus congestion which can cause sinus pain, sinus headaches, throat pain, painful swallowing, coughing.  You can take this together with pseudoephedrine (Sudafed) at a dose of 30-60 mg 3 times a day or twice daily as needed for the same kind of nasal drip, congestion.  Use cough medication as needed.   If you continue to have fever on 05/20/2022, then you have to quarantine until 05/24/22 and would be able to return to work on 05/25/2022.

## 2022-05-17 NOTE — ED Provider Notes (Signed)
[facility]  Note:  This document was prepared using Conservation officer, historic buildings and may include unintentional dictation errors.  MRN: [id number] DOB: 03-Sep-1980  Subjective:   [name] is a 41 y.o. male presenting for 3-day history of acute onset coughing, body aches, loss of taste, sinus congestion. Had 2 positive COVID tests at home and shows results from his phone.  No chest pain, shortness of breath or wheezing.  No smoking, vaping, cigarette use.  He does have a history of alpha thalassemia and migraines.  Currently only uses Zomig as needed and propranolol.  No current facility-administered medications for this encounter.  Current Outpatient Medications:    aspirin-acetaminophen-caffeine (EXCEDRIN MIGRAINE) 250-250-65 MG tablet, Take by mouth every 6 (six) hours as needed for headache., Disp: , Rfl:    magnesium oxide (MAG-OX) 400 MG tablet, Take 200 mg by mouth daily., Disp: , Rfl:    propranolol (INDERAL) 40 MG tablet, Take 1 tablet (40 mg total) by mouth 2 (two) times daily., Disp: 180 tablet, Rfl: 3   Yohimbe Bark (YOHIMBE PO), Take by mouth., Disp: , Rfl:    zolmitriptan (ZOMIG) 5 MG tablet, Take 1 tablet (5 mg total) by mouth as needed for migraine or headache. May repeat in 2 hours if headache persists or recurs., Disp: 10 tablet, Rfl: 5   No Known Allergies  Past Medical History:  Diagnosis Date   Closed head injury    "bleeding and bruising of brain on both sides"   Murmur    Pelvic fracture ([facility]) 2008   external fixator   Pneumothorax, left    Seizures ([facility])      Past Surgical History:  Procedure Laterality Date   BONY PELVIS SURGERY  2008   external fixator   KNEE SURGERY Right '08,'09,'10,'11.   4 surgeries in 4 yrs   THORACENTESIS      Family History  Problem Relation Age of Onset   High blood pressure Mother    Seizures Brother    Seizures Son    Seizures Son     Social History   Tobacco Use   Smoking  status: Former    Packs/day: 0.30    Years: 10.00    Total pack years: 3.00    Types: Cigarettes   Smokeless tobacco: Never  Vaping Use   Vaping Use: Every day  Substance Use Topics   Alcohol use: Yes    Alcohol/week: 7.0 standard drinks of alcohol    Types: 7 Cans of beer per week    Comment: socially   Drug use: No    ROS   Objective:   Vitals: BP (!) 140/96 (BP Location: Right Arm)   Pulse (!) 54   Temp 98.4 F (36.9 C) (Oral)   Resp 20   SpO2 97%   Physical Exam Constitutional:      General: He is not in acute distress.    Appearance: Normal appearance. He is well-developed and normal weight. He is not ill-appearing, toxic-appearing or diaphoretic.  HENT:     Head: Normocephalic and atraumatic.     Right Ear: Tympanic membrane, ear canal and external ear normal. No drainage, swelling or tenderness. No middle ear effusion. There is no impacted cerumen. Tympanic membrane is not erythematous or bulging.     Left Ear: Tympanic membrane, ear canal and external ear normal. No drainage, swelling or tenderness.  No middle ear effusion. There is no impacted cerumen. Tympanic membrane is not erythematous or bulging.  Nose: Congestion and rhinorrhea present.     Mouth/Throat:     Mouth: Mucous membranes are moist.     Pharynx: No oropharyngeal exudate or posterior oropharyngeal erythema.  Eyes:     General: No scleral icterus.       Right eye: No discharge.        Left eye: No discharge.     Extraocular Movements: Extraocular movements intact.     Conjunctiva/sclera: Conjunctivae normal.  Cardiovascular:     Rate and Rhythm: Normal rate and regular rhythm.     Heart sounds: Normal heart sounds. No murmur heard.    No friction rub. No gallop.  Pulmonary:     Effort: Pulmonary effort is normal. No respiratory distress.     Breath sounds: Normal breath sounds. No stridor. No wheezing, rhonchi or rales.  Musculoskeletal:     Cervical back: Normal range of motion and  neck supple. No rigidity. No muscular tenderness.  Neurological:     General: No focal deficit present.     Mental Status: He is alert and oriented to person, place, and time.  Psychiatric:        Mood and Affect: Mood normal.        Behavior: Behavior normal.        Thought Content: Thought content normal.     Assessment and Plan :   PDMP not reviewed this encounter.  1. Clinical diagnosis of COVID-19     Start Paxlovid, last GFR greater than 60 and patient endorses no history of CKD. Deferred imaging given clear cardiopulmonary exam, hemodynamically stable vital signs.   Otherwise, will manage COVID-19 with supportive care. Counseled patient on nature of COVID-19 including modes of transmission, diagnostic testing, management and supportive care.  Offered symptomatic relief. Counseled patient on potential for adverse effects with medications prescribed/recommended today, strict ER and return-to-clinic precautions discussed, patient verbalized understanding.     Wallis Bamberg, [place] 05/18/22 [phone]

## 2022-05-17 NOTE — ED Triage Notes (Signed)
Pt reports Cough, some mucus, body aches, loss a bit of taste x 3 days. Took advil, mucinex, and halls and tea, and vic vapor rub which gave slight relief.

## 2022-06-18 ENCOUNTER — Ambulatory Visit: Payer: BC Managed Care – PPO | Admitting: Orthopedic Surgery

## 2022-06-23 ENCOUNTER — Ambulatory Visit (INDEPENDENT_AMBULATORY_CARE_PROVIDER_SITE_OTHER): Payer: BC Managed Care – PPO

## 2022-06-23 ENCOUNTER — Ambulatory Visit (INDEPENDENT_AMBULATORY_CARE_PROVIDER_SITE_OTHER): Payer: BC Managed Care – PPO | Admitting: Orthopedic Surgery

## 2022-06-23 VITALS — Ht 73.0 in | Wt 210.0 lb

## 2022-06-23 DIAGNOSIS — M545 Low back pain, unspecified: Secondary | ICD-10-CM | POA: Diagnosis not present

## 2022-06-24 ENCOUNTER — Encounter: Payer: Self-pay | Admitting: Orthopedic Surgery

## 2022-06-24 NOTE — Progress Notes (Unsigned)
Office Visit Note   Patient: [name]           Date of Birth: 06-Nov-1980           MRN: [id number] Visit Date: 06/23/2022 Requested by: Haydee Salter, MD [place],  [place] [address] PCP: Haydee Salter, MD  Subjective: Chief Complaint  Patient presents with   Lower Back - Pain    HPI: [name] is a 42 y.o. male who presents to the office reporting low back pain without leg pain.  No recent injury.  Reports constant pain for 6 weeks.  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  Uses muscle relaxer as well as heating pad which helps marginally.  Also is taking Neurontin amitriptyline and tizanidine.  Denies any radicular leg pain and no numbness and tingling in the toes.  Pain does not wake him from sleep.  He complains of not being able to go to sleep.  Describes some pain with forward bending.  Has had prior epidural steroid injections with Dr. Ernestina Patches in 2021 and 2018 with some relief.  He does do a lot of long distance truck driving.  Last MRI scan was 2018 of the lumbar spine..                ROS: All systems reviewed are negative as they relate to the chief complaint within the history of present illness.  Patient denies fevers or chills.  Assessment & Plan: Visit Diagnoses:  1. Low back pain, unspecified back pain laterality, unspecified chronicity, unspecified whether sciatica present     Plan: Impression is low back pain which looks to be more facet arthritis mediated.  He has failed a course of conservative treatment.  Has a high risk job with a lot of sitting and he is currently taking a lot of medication.  Radiographs do not show any spondylolisthesis or compression fractures.  I think we need a new scan so that we can resume epidural steroid injections.  Follow-up after that study or we may also just be able to send him directly up to Dr. Ernestina Patches for lumbar spine ESI's.  Follow-Up Instructions: No follow-ups on  file.   Orders:  Orders Placed This Encounter  Procedures   XR Lumbar Spine 2-3 Views   MR Lumbar Spine w/o contrast   No orders of the defined types were placed in this encounter.     Procedures: No procedures performed   Clinical Data: No additional findings.  Objective: Vital Signs: Ht 6\' 1"  (1.854 m)   Wt 210 lb (95.3 kg)   BMI 27.71 kg/m   Physical Exam:  Constitutional: Patient appears well-developed HEENT:  Head: Normocephalic Eyes:EOM are normal Neck: Normal range of motion Cardiovascular: Normal rate Pulmonary/chest: Effort normal Neurologic: Patient is alert Skin: Skin is warm Psychiatric: Patient has normal mood and affect  Ortho Exam: Ortho exam demonstrates full active and passive range of motion of the knees and ankles.  He has good ankle dorsiflexion plantarflexion quad and hamstring strength with very good hip flexion strength.  No nerve root tension signs.  Does have some mild pain with forward lateral bending but no trochanteric tenderness.  Specialty Comments:  No specialty comments available.  Imaging: No results found.   PMFS History: Patient Active Problem List   Diagnosis Date Noted   Erectile dysfunction 11/12/2020   History of deep venous thrombosis of RUE 09/03/2020   Migraine headache  without aura 08/03/2020   Chronic constipation 08/03/2020   Radiculopathy due to lumbar intervertebral disc disorder 08/03/2020   Prediabetes 08/03/2020   Borderline hyperlipidemia 08/03/2020   Seizures ([place]) 08/12/2013   Alpha thalassemia ([place]) 02/28/2010   Pneumothorax 02/28/2010   Past Medical History:  Diagnosis Date   Closed head injury    "bleeding and bruising of brain on both sides"   Murmur    Pelvic fracture ([place]) 2008   external fixator   Pneumothorax, left    Seizures ([place])     Family History  Problem Relation Age of Onset   High blood pressure Mother    Seizures Brother    Seizures Son    Seizures Son     Past Surgical  History:  Procedure Laterality Date   BONY PELVIS SURGERY  2008   external fixator   KNEE SURGERY Right '08,'09,'10,'11.   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

## 2022-07-07 ENCOUNTER — Ambulatory Visit
Admission: RE | Admit: 2022-07-07 | Discharge: 2022-07-07 | Disposition: A | Discharge status: Home / Self Care | Payer: BC Managed Care – PPO | Source: Ambulatory Visit | Attending: Orthopedic Surgery | Admitting: Orthopedic Surgery

## 2022-07-07 DIAGNOSIS — M545 Low back pain, unspecified: Secondary | ICD-10-CM

## 2022-10-13 ENCOUNTER — Ambulatory Visit: Payer: BC Managed Care – PPO | Admitting: Family Medicine

## 2022-10-13 DIAGNOSIS — G43009 Migraine without aura, not intractable, without status migrainosus: Secondary | ICD-10-CM

## 2022-10-13 MED ORDER — ZOLMITRIPTAN 5 MG PO TABS: ORAL_TABLET | ORAL | 5 refills | Status: DC

## 2022-10-13 NOTE — Progress Notes (Signed)
[facility] [address] [place] [address] Dept: [phone] Dept Fax: [phone]  Chronic Care Office Visit  Subjective:    Patient ID: [name], male    DOB: [birth year], 42 y.o..   MRN: [id number]  Chief Complaint  Patient presents with   Medical Management of Chronic Issues    6 month f/u migraine HA's.     History of Present Illness:  Patient is in today for reassessment of chronic medical issues.  [name] has a history of migraine headaches. He is managed on propranolol 40 mg bid for prevention and Zomig 5 mg for acute migraine treatment. This regimen had reduced his headaches significantly. He has had 3 migraines int he past month. These do resolve with getting into a cool, dark place and taking Zomig.  He is now driving trucks during the daytime more, which he feels is increasing his migraine frequency a small amount. He would like to pursue eyewear (such as transition lenses) to decrease bright sun exposure.  Past Medical History: Patient Active Problem List   Diagnosis Date Noted   Erectile dysfunction 11/12/2020   History of deep venous thrombosis of RUE 09/03/2020   Migraine headache without aura 08/03/2020   Chronic constipation 08/03/2020   Radiculopathy due to lumbar intervertebral disc disorder 08/03/2020   Prediabetes 08/03/2020   Borderline hyperlipidemia 08/03/2020   Seizures (HCC) 08/12/2013   Alpha thalassemia (HCC) 02/28/2010   Pneumothorax 02/28/2010   Past Surgical History:  Procedure Laterality Date   BONY PELVIS SURGERY  2008   external fixator   KNEE SURGERY Right '08,'09,'10,'11.   4 surgeries in 4 yrs   THORACENTESIS     Family History  Problem Relation Age of Onset   High blood pressure Mother    Seizures Brother    Seizures Son    Seizures Son    Outpatient Medications Prior to Visit  Medication Sig Dispense Refill   aspirin-acetaminophen-caffeine (EXCEDRIN  MIGRAINE) 250-250-65 MG tablet Take by mouth every 6 (six) hours as needed for headache.     magnesium oxide (MAG-OX) 400 MG tablet Take 200 mg by mouth daily.     propranolol (INDERAL) 40 MG tablet Take 1 tablet (40 mg total) by mouth 2 (two) times daily. 180 tablet 3   zolmitriptan (ZOMIG) 5 MG tablet Take 1 tablet (5 mg total) by mouth as needed for migraine or headache. May repeat in 2 hours if headache persists or recurs. 10 tablet 5   Yohimbe Bark (YOHIMBE PO) Take by mouth. (Patient not taking: Reported on 10/13/2022)     nirmatrelvir & ritonavir (PAXLOVID, 300/100,) 20 x 150 MG & 10 x 100MG  TBPK Take 2 tablets nirmtrelvir and 1 tablet ritonavir twice daily. 30 tablet 0   promethazine-dextromethorphan (PROMETHAZINE-DM) 6.25-15 MG/5ML syrup Take 2.5 mLs by mouth 3 (three) times daily as needed for cough. 100 mL 0   No facility-administered medications prior to visit.   No Known Allergies Objective:   Today's Vitals   10/13/22 1032  BP: 132/78  Pulse: 97  Temp: 98.4 F (36.9 C)  TempSrc: Temporal  SpO2: 99%  Weight: 214 lb (97.1 kg)  Height: 6\' 1"  (1.854 m)   Body mass index is 28.23 kg/m.   General: Well developed, well nourished. No acute distress. Psych: Alert and oriented. Normal mood and affect.  There are no preventive care reminders to display for this patient.    Assessment & Plan:   Problem List Items Addressed  This Visit       Cardiovascular and Mediastinum   Migraine headache without aura    Stable. Overall, well managed on current regimen. Continue propranolol 40 mg bid and Zomig 5 mg as needed for acute migraine. I recommend he see an eye doctor to discuss the use of special lenses for migraine prevention.      Relevant Medications   zolmitriptan (ZOMIG) 5 MG tablet    Return in about 6 months (around 04/15/2023) for Reassessment.   Loyola Mast, MD

## 2022-10-13 NOTE — Assessment & Plan Note (Signed)
Stable. Overall, well managed on current regimen. Continue propranolol 40 mg bid and Zomig 5 mg as needed for acute migraine. I recommend he see an eye doctor to discuss the use of special lenses for migraine prevention.

## 2022-10-23 ENCOUNTER — Other Ambulatory Visit: Payer: Self-pay | Admitting: Family Medicine

## 2022-10-23 NOTE — Telephone Encounter (Signed)
Spoke  to patient and he is taking 40 mg BID.  Not the 20 mg.  Dm/cma

## 2023-04-20 ENCOUNTER — Ambulatory Visit: Payer: BC Managed Care – PPO | Admitting: Family Medicine

## 2023-04-20 VITALS — BP 124/80 | HR 82 | Temp 98.2°F | Ht 73.0 in | Wt 223.6 lb

## 2023-04-20 DIAGNOSIS — G4709 Other insomnia: Secondary | ICD-10-CM

## 2023-04-20 DIAGNOSIS — G43009 Migraine without aura, not intractable, without status migrainosus: Secondary | ICD-10-CM | POA: Diagnosis not present

## 2023-04-20 MED ORDER — PROPRANOLOL HCL 40 MG PO TABS
40.0000 mg | ORAL_TABLET | Freq: Two times a day (BID) | ORAL | 3 refills | Status: AC
Start: 2023-04-20 — End: ?

## 2023-04-20 MED ORDER — AMITRIPTYLINE HCL 25 MG PO TABS: 25.0000 mg | ORAL_TABLET | Freq: Every evening | ORAL | 5 refills | Status: DC | PRN

## 2023-04-20 NOTE — Assessment & Plan Note (Signed)
Stable. Overall, well managed on current regimen. Continue propranolol 40 mg bid and Zomig 5 mg as needed for acute migraine.

## 2023-04-20 NOTE — Assessment & Plan Note (Signed)
Recommend a trial of amitriptyline 25 mg at bedtime for sleep. 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

## 2023-04-20 NOTE — Progress Notes (Signed)
[facility] [address] [place] [address] Dept: [phone] Dept Fax: [phone]  Chronic Care Office Visit  Subjective:    Patient ID: [name], male    DOB: Jun 25, 1980, 42 y.o..   MRN: [id number]  Chief Complaint  Patient presents with   Migraine    6 month f/u.   C/o having trouble sleeping.  Has taken Melatonin with little help   History of Present Illness:  Patient is in today for reassessment of chronic medical issues.  Mr. [name] has a history of migraine headaches. He is managed on propranolol 40 mg bid for prevention and Zomig 5 mg for acute migraine treatment. He feels that this regimen is working well for him overall.  Mr. [name] notes he has issues chronically with insomnia. He finds that initiation of sleep is very difficult. He is a Agricultural consultant.  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 He does have varied sleep times. He notes significant fatigue. He has tried melatonin without benefit.  Past Medical History: Patient Active Problem List   Diagnosis Date Noted   Other insomnia 04/20/2023   Erectile dysfunction 11/12/2020   History of deep venous thrombosis of RUE 09/03/2020   Migraine headache without aura 08/03/2020   Chronic constipation 08/03/2020   Radiculopathy due to lumbar intervertebral disc disorder 08/03/2020   Prediabetes 08/03/2020   Borderline hyperlipidemia 08/03/2020   Seizures (HCC) 08/12/2013   Alpha thalassemia (HCC) 02/28/2010   Pneumothorax 02/28/2010   Past Surgical History:  Procedure Laterality Date   BONY PELVIS SURGERY  2008   external fixator   KNEE SURGERY Right '08,'09,'10,'11.   4 surgeries in 4 yrs   THORACENTESIS     Family History  Problem Relation Age of Onset   High blood pressure Mother    Seizures Brother    Seizures Son    Seizures Son    Outpatient Medications Prior to Visit  Medication Sig  Dispense Refill   aspirin-acetaminophen-caffeine (EXCEDRIN MIGRAINE) 250-250-65 MG tablet Take by mouth every 6 (six) hours as needed for headache.     zolmitriptan (ZOMIG) 5 MG tablet Take 1 tablet (5 mg total) by mouth as needed for migraine or headache. May repeat in 2 hours if headache persists or recurs. 10 tablet 5   magnesium oxide (MAG-OX) 400 MG tablet Take 200 mg by mouth daily.     propranolol (INDERAL) 40 MG tablet Take 1 tablet (40 mg total) by mouth 2 (two) times daily. 180 tablet 3   Yohimbe Bark (YOHIMBE PO) Take by mouth.     No facility-administered medications prior to visit.   No Known Allergies Objective:   Today's Vitals   04/20/23 1005  BP: 124/80  Pulse: 82  Temp: 98.2 F (36.8 C)  TempSrc: Temporal  SpO2: 99%  Weight: 223 lb 9.6 oz (101.4 kg)  Height: 6\' 1"  (1.854 m)   Body mass index is 29.5 kg/m.   General: Well developed, well nourished. No acute distress. Psych: Alert and oriented. Normal mood and affect.  There are no preventive care reminders to display for this patient.    Assessment & Plan:   Problem List Items Addressed This Visit       Cardiovascular and Mediastinum   Migraine headache without aura - Primary    Stable. Overall, well managed on current regimen. Continue propranolol 40 mg bid and Zomig 5 mg as needed for acute migraine.  Relevant Medications   amitriptyline (ELAVIL) 25 MG tablet   propranolol (INDERAL) 40 MG tablet     Other   Other insomnia    Recommend a trial of amitriptyline 25 mg at bedtime for sleep. 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      Relevant Medications   amitriptyline (ELAVIL) 25 MG tablet    Return in about 6 months (around 10/18/2023) for Reassessment.   Loyola Mast, MD

## 2023-05-13 ENCOUNTER — Other Ambulatory Visit: Payer: Self-pay | Admitting: Family Medicine

## 2023-05-13 DIAGNOSIS — G4709 Other insomnia: Secondary | ICD-10-CM

## 2023-06-29 ENCOUNTER — Telehealth: Payer: Self-pay | Admitting: Family Medicine

## 2023-06-29 NOTE — Telephone Encounter (Signed)
Gave patient the number to [facility] office to call to get them to send the notes due to they ordered the heart monitoring.  Dm/cma

## 2023-06-29 NOTE — Telephone Encounter (Signed)
Copied from CRM [id number]. Topic: Clinical - Medical Advice >> Jun 29, 2023 11:18 AM Theodis Sato wrote: Reason for CRM: Patient is requesting a phone call back from Dr. Rondel Oh nurse regarding the heart monitoring equipment as patient states he was billed for this and the office needs to help him fix it.

## 2023-10-22 ENCOUNTER — Other Ambulatory Visit: Payer: Self-pay | Admitting: Family Medicine

## 2023-10-22 DIAGNOSIS — G43009 Migraine without aura, not intractable, without status migrainosus: Secondary | ICD-10-CM

## 2023-10-22 NOTE — Telephone Encounter (Unsigned)
 Copied from CRM [id number]. Topic: Clinical - Medication Refill >> Oct 22, 2023  2:27 PM Clyde Darling P wrote: Medication: zolmitriptan  (ZOMIG ) 5 MG tablet  Has the patient contacted their pharmacy? Yes (Agent: If no, request that the patient contact the pharmacy for the refill. If patient does not wish to contact the pharmacy document the reason why and proceed with request.) (Agent: If yes, when and what did the pharmacy advise?)  This is the patient's preferred pharmacy:  [id number] [place], [place] - [address] [place] [address] Phone: [phone] Fax: [phone]  Is this the correct pharmacy for this prescription? Yes If no, delete pharmacy and type the correct one.   Has the prescription been filled recently? No  Is the patient out of the medication? Yes- on last pill  Has the patient been seen for an appointment in the last year OR does the patient have an upcoming appointment? Yes  Can we respond through MyChart? Yes  Agent: Please be advised that Rx refills may take up to 3 business days. We ask that you follow-up with your pharmacy.

## 2023-10-26 ENCOUNTER — Encounter: Payer: Self-pay | Admitting: Family Medicine

## 2023-10-26 ENCOUNTER — Telehealth: Payer: Self-pay

## 2023-10-26 ENCOUNTER — Ambulatory Visit: Payer: Self-pay | Admitting: Family Medicine

## 2023-10-26 ENCOUNTER — Ambulatory Visit: Payer: BC Managed Care – PPO | Admitting: Family Medicine

## 2023-10-26 VITALS — BP 132/80 | HR 86 | Temp 97.4°F | Ht 73.0 in | Wt 224.0 lb

## 2023-10-26 DIAGNOSIS — E785 Hyperlipidemia, unspecified: Secondary | ICD-10-CM | POA: Diagnosis not present

## 2023-10-26 DIAGNOSIS — Z113 Encounter for screening for infections with a predominantly sexual mode of transmission: Secondary | ICD-10-CM

## 2023-10-26 DIAGNOSIS — R7303 Prediabetes: Secondary | ICD-10-CM | POA: Diagnosis not present

## 2023-10-26 DIAGNOSIS — G43009 Migraine without aura, not intractable, without status migrainosus: Secondary | ICD-10-CM

## 2023-10-26 LAB — HEMOGLOBIN A1C: Hgb A1c MFr Bld: 6.5 % (ref 4.6–6.5)

## 2023-10-26 LAB — LIPID PANEL
Cholesterol: 188 mg/dL (ref 0–200)
HDL: 43.4 mg/dL (ref >39.00)
LDL Cholesterol: 119 mg/dL — ABNORMAL HIGH (ref 0–99)
NonHDL: 144.22
Total CHOL/HDL Ratio: 4
Triglycerides: 127 mg/dL (ref 0.0–149.0)
VLDL: 25.4 mg/dL (ref 0.0–40.0)

## 2023-10-26 LAB — BASIC METABOLIC PANEL WITH GFR
BUN: 11 mg/dL (ref 6–23)
CO2: 28 meq/L (ref 19–32)
Calcium: 9.5 mg/dL (ref 8.4–10.5)
Chloride: 102 meq/L (ref 96–112)
Creatinine, Ser: 0.92 mg/dL (ref 0.40–1.50)
GFR: 101.33 mL/min (ref >60.00)
Glucose, Bld: 103 mg/dL — ABNORMAL HIGH (ref 70–99)
Potassium: 3.9 meq/L (ref 3.5–5.1)
Sodium: 138 meq/L (ref 135–145)

## 2023-10-26 MED ORDER — ZOLMITRIPTAN 5 MG PO TABS: ORAL_TABLET | ORAL | 5 refills | Status: AC

## 2023-10-26 NOTE — Telephone Encounter (Signed)
 Copied from CRM [id number]. Topic: Clinical - Prescription Issue >> Oct 26, 2023 12:29 PM [name] wrote: [name] pharmacy was out of the zolmitriptan  (ZOMIG ) 5 MG tablet and they can another prescription of it to [id number] - , [place] - [address] with Approval from Doctor, [name] needs this filled today before going out of town

## 2023-10-26 NOTE — Telephone Encounter (Signed)
 Left VM to have him call and have them transfer the RX.  Tried to call but pharmacy number isn't working right now. Dm/cma

## 2023-10-26 NOTE — Assessment & Plan Note (Signed)
 I will check an A1c and blood sugar. [name] is starting a new workout routine which should help him prevent diabetes development.

## 2023-10-26 NOTE — Assessment & Plan Note (Signed)
 Stable, though recent major migraine. Overall, well managed on current regimen. Continue propranolol  40 mg bid and Zomig  5 mg as needed for acute migraine. I reviewed the guidance to repeat this Zomig  dose if the headache is still persistent at 2 hours. I will increase his quantity dispensed to #15 tabs.

## 2023-10-26 NOTE — Progress Notes (Signed)
 [facility] [address] [place] [address] Dept: [phone] Dept Fax: [phone]  Chronic Care Office Visit  Subjective:    Patient ID: [name], male    DOB: August 14, 1980, 43 y.o..   MRN: [id number]  Chief Complaint  Patient presents with   Follow-up    F/u migraine.     History of Present Illness:  Patient is in today for reassessment of chronic medical issues.  Mr. Tift has a history of migraine headaches. He is managed on propranolol  40 mg bid for prevention and Zomig  5 mg for acute migraine treatment. He notes last week he had a major migraine with associated nausea, but also some other flu-like symptoms. Despite a persistent headache, he has not been taking a repeat dose of Zomig  within 2 hours, worried about not having enough medicine available.  Past Medical History: Patient Active Problem List   Diagnosis Date Noted   Other insomnia 04/20/2023   Erectile dysfunction 11/12/2020   History of deep venous thrombosis of RUE 09/03/2020   Migraine headache without aura 08/03/2020   Chronic constipation 08/03/2020   Radiculopathy due to lumbar intervertebral disc disorder 08/03/2020   Prediabetes 08/03/2020   Borderline hyperlipidemia 08/03/2020   Seizures (HCC) 08/12/2013   Alpha thalassemia (HCC) 02/28/2010   Pneumothorax 02/28/2010   Past Surgical History:  Procedure Laterality Date   BONY PELVIS SURGERY  2008   external fixator   KNEE SURGERY Right '08,'09,'10,'11.   4 surgeries in 4 yrs   THORACENTESIS     Family History  Problem Relation Age of Onset   High blood pressure Mother    Seizures Brother    Seizures Son    Seizures Son    Outpatient Medications Prior to Visit  Medication Sig Dispense Refill   amitriptyline  (ELAVIL ) 25 MG tablet TAKE 1 TABLET (25 MG TOTAL) BY MOUTH AT BEDTIME AS NEEDED FOR SLEEP 90 tablet 2   aspirin-acetaminophen -caffeine (EXCEDRIN MIGRAINE) 250-250-65 MG  tablet Take by mouth every 6 (six) hours as needed for headache.     propranolol  (INDERAL ) 40 MG tablet Take 1 tablet (40 mg total) by mouth 2 (two) times daily. 180 tablet 3   zolmitriptan  (ZOMIG ) 5 MG tablet TAKE 1 TABLET BY MOUTH AS NEEDED FOR MIGRAINE/HEADACHE MAY REPEAT IN 2 HOURS IF HEADACHE PERSISTS 10 tablet 5   No facility-administered medications prior to visit.   No Known Allergies Objective:   Today's Vitals   10/26/23 0951  BP: 132/80  Pulse: 86  Temp: (!) 97.4 F (36.3 C)  TempSrc: Temporal  SpO2: 99%  Weight: 224 lb (101.6 kg)  Height: 6\' 1"  (1.854 m)   Body mass index is 29.55 kg/m.   General: Well developed, well nourished. No acute distress. Psych: Alert and oriented. Normal mood and affect.  Health Maintenance Due  Topic Date Due   Pneumococcal Vaccine 10-93 Years old (1 of 2 - PCV) Never done     Assessment & Plan:   Problem List Items Addressed This Visit       Cardiovascular and Mediastinum   Migraine headache without aura   Stable, though recent major migraine. Overall, well managed on current regimen. Continue propranolol  40 mg bid and Zomig  5 mg as needed for acute migraine. I reviewed the guidance to repeat this Zomig  dose if the headache is still persistent at 2 hours. I will increase his quantity dispensed to #15 tabs.      Relevant Medications   zolmitriptan  (  ZOMIG ) 5 MG tablet     Other   Borderline hyperlipidemia   I will check lipids today.      Relevant Orders   Lipid panel   Prediabetes - Primary   I will check an A1c and blood sugar. Mr. Gaffey is starting a new workout routine which should help him prevent diabetes development.      Relevant Orders   Hemoglobin A1c   Basic metabolic panel with GFR   Other Visit Diagnoses       Screen for STD (sexually transmitted disease)       Mr. Dais is in a relationship that is progressing to being committed (monogamous). He requests screening.   Relevant Orders   Chlamydia/GC  NAA, Confirmation   HIV Antibody (routine testing w rflx)   RPR       Return in about 6 months (around 04/26/2024) for Reassessment.   Graig Lawyer, MD

## 2023-10-26 NOTE — Assessment & Plan Note (Signed)
 I will check lipids today.

## 2023-10-27 LAB — HIV ANTIBODY (ROUTINE TESTING W REFLEX): HIV 1&2 Ab, 4th Generation: NONREACTIVE

## 2023-10-27 LAB — RPR: RPR Ser Ql: NONREACTIVE

## 2023-10-28 LAB — CHLAMYDIA/GC NAA, CONFIRMATION
Chlamydia trachomatis, NAA: NEGATIVE
Neisseria gonorrhoeae, NAA: NEGATIVE

## 2024-02-03 ENCOUNTER — Other Ambulatory Visit: Payer: Self-pay | Admitting: Family Medicine

## 2024-02-03 DIAGNOSIS — G4709 Other insomnia: Secondary | ICD-10-CM

## 2024-05-02 ENCOUNTER — Ambulatory Visit: Admitting: Family Medicine

## 2024-05-02 ENCOUNTER — Ambulatory Visit: Payer: Self-pay | Admitting: Family Medicine

## 2024-05-02 ENCOUNTER — Encounter: Payer: Self-pay | Admitting: Family Medicine

## 2024-05-02 VITALS — BP 134/80 | HR 82 | Temp 97.9°F | Ht 73.0 in | Wt 230.0 lb

## 2024-05-02 DIAGNOSIS — G43009 Migraine without aura, not intractable, without status migrainosus: Secondary | ICD-10-CM

## 2024-05-02 DIAGNOSIS — G4709 Other insomnia: Secondary | ICD-10-CM | POA: Diagnosis not present

## 2024-05-02 DIAGNOSIS — R7303 Prediabetes: Secondary | ICD-10-CM

## 2024-05-02 LAB — HEMOGLOBIN A1C: Hgb A1c MFr Bld: 6.2 % (ref 4.6–6.5)

## 2024-05-02 LAB — GLUCOSE, RANDOM: Glucose, Bld: 97 mg/dL (ref 70–99)

## 2024-05-02 MED ORDER — AMITRIPTYLINE HCL 25 MG PO TABS: 25.0000 mg | ORAL_TABLET | Freq: Every evening | ORAL | Status: AC | PRN

## 2024-05-02 NOTE — Assessment & Plan Note (Signed)
 I will check an A1c and blood sugar.

## 2024-05-02 NOTE — Assessment & Plan Note (Signed)
 Improved. Continue amitriptyline  25-50 mg at bedtime as needed for sleep.

## 2024-05-02 NOTE — Progress Notes (Signed)
 [facility] [address] [place] [address] Dept: [phone] Dept Fax: [phone]  Chronic Care Office Visit  Subjective:    Patient ID: [name], male    DOB: 07/07/1980, 43 y.o..   MRN: [id number]  Chief Complaint  Patient presents with   Migraine    F/u migraines.   Declines flu shot.    History of Present Illness:  Patient is in today for reassessment of chronic medical conditions.   Mr. Felten has a history of migraine headaches. He is managed on propranolol  40 mg bid and amitriptyline  25 mg qhs for prevention and Zomig  5 mg for acute migraine treatment. He notes he is having about 1 migraine a month, which is an improvement.  Mr. Mccutchan has issues chronically with insomnia. He finds that initiation of sleep is very difficult. 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 He was prescribed amitriptyline  25 mg at bedtime last year. He has noted this seems to work better when he takes 2 tablets.  Mr. Sofia had an A1c of 6.5% at his last visit. He does have concerns for development of diabetes.   Past Medical History: Patient Active Problem List   Diagnosis Date Noted   Other insomnia 04/20/2023   Erectile dysfunction 11/12/2020   History of deep venous thrombosis of RUE 09/03/2020   Migraine headache without aura 08/03/2020   Chronic constipation 08/03/2020   Radiculopathy due to lumbar intervertebral disc disorder 08/03/2020   Prediabetes 08/03/2020   Borderline hyperlipidemia 08/03/2020   Seizures (HCC) 08/12/2013   Alpha thalassemia (HCC) 02/28/2010   Pneumothorax 02/28/2010   Past Surgical History:  Procedure Laterality Date   BONY PELVIS SURGERY  2008   external fixator   KNEE SURGERY Right '08,'09,'10,'11.   4 surgeries in 4 yrs   THORACENTESIS     Family History  Problem Relation Age of Onset   High blood pressure Mother     Seizures Brother    Seizures Son    Seizures Son    Outpatient Medications Prior to Visit  Medication Sig Dispense Refill   amitriptyline  (ELAVIL ) 25 MG tablet TAKE 1 TABLET (25 MG TOTAL) BY MOUTH AT BEDTIME AS NEEDED FOR SLEEP 90 tablet 3   aspirin-acetaminophen -caffeine (EXCEDRIN MIGRAINE) 250-250-65 MG tablet Take by mouth every 6 (six) hours as needed for headache.     propranolol  (INDERAL ) 40 MG tablet Take 1 tablet (40 mg total) by mouth 2 (two) times daily. 180 tablet 3   zolmitriptan  (ZOMIG ) 5 MG tablet TAKE 1 TABLET BY MOUTH AS NEEDED FOR MIGRAINE/HEADACHE MAY REPEAT IN 2 HOURS IF HEADACHE PERSISTS 15 tablet 5   No facility-administered medications prior to visit.   No Known Allergies   Objective:   Today's Vitals   05/02/24 1005  BP: 134/80  Pulse: 82  Temp: 97.9 F (36.6 C)  TempSrc: Temporal  SpO2: 99%  Weight: 230 lb (104.3 kg)  Height: 6' 1 (1.854 m)   Body mass index is 30.34 kg/m.   General: Well developed, well nourished. No acute distress. Psych: Alert and oriented. Normal mood and affect.  Health Maintenance Due  Topic Date Due   Pneumococcal Vaccine (1 of 2 - PCV) Never done   Hepatitis B Vaccines 19-59 Average Risk (1 of 3 - 19+ 3-dose series) Never done   HPV VACCINES (1 - 3-dose SCDM series) Never done   Influenza Vaccine  Never done   COVID-19 Vaccine (3 - 2025-26 season) 01/25/2024   Lab Results Lab Results  Component Value Date   HGBA1C 6.5 10/26/2023   HGBA1C 6.2 08/03/2020   Lab Results  Component Value Date   CHOL 188 10/26/2023   HDL 43.40 10/26/2023   LDLCALC 119 (H) 10/26/2023   TRIG 127.0 10/26/2023   CHOLHDL 4 10/26/2023     Assessment & Plan:   Problem List Items Addressed This Visit       Cardiovascular and Mediastinum   Migraine headache without aura - Primary   Stable. Migraines averaging once a month. Continue propranolol  40 mg bid for prevention and Zomig  5 mg as needed for acute migraine.      Relevant  Medications   amitriptyline  (ELAVIL ) 25 MG tablet     Other   Other insomnia   Improved. Continue amitriptyline  25-50 mg at bedtime as needed for sleep.      Relevant Medications   amitriptyline  (ELAVIL ) 25 MG tablet   Prediabetes   I will check an A1c and blood sugar.      Relevant Orders   Glucose, random   Hemoglobin A1c    Return in about 6 months (around 10/31/2024) for Reassessment.   Garnette CHRISTELLA Simpler, MD  I,Emily Lagle,acting as a scribe for Garnette CHRISTELLA Simpler, MD.,have documented all relevant documentation on the behalf of Garnette CHRISTELLA Simpler, MD.  I, Garnette CHRISTELLA Simpler, MD, have reviewed all documentation for this visit. The documentation on 05/02/2024 for the exam, diagnosis, procedures, and orders are all accurate and complete.

## 2024-05-02 NOTE — Assessment & Plan Note (Addendum)
 Stable. Migraines averaging once a month. Continue propranolol  40 mg bid for prevention and Zomig  5 mg as needed for acute migraine.

## 2024-05-05 ENCOUNTER — Other Ambulatory Visit: Payer: Self-pay | Admitting: Family Medicine

## 2024-05-05 DIAGNOSIS — G43009 Migraine without aura, not intractable, without status migrainosus: Secondary | ICD-10-CM

## 2024-10-31 ENCOUNTER — Ambulatory Visit: Admitting: Family Medicine
